# Patient Record
Sex: Male | Born: 1995
Health system: Southern US, Community
[De-identification: ages and names within clinical notes are randomized; demographics above are authoritative.]

## PROBLEM LIST (undated history)

## (undated) ENCOUNTER — Emergency Department (HOSPITAL_BASED_OUTPATIENT_CLINIC_OR_DEPARTMENT_OTHER): Admission: EM | Disposition: A | Discharge status: Home / Self Care

## (undated) DIAGNOSIS — I82409 Acute embolism and thrombosis of unspecified deep veins of unspecified lower extremity: Secondary | ICD-10-CM

---

## 2019-02-22 ENCOUNTER — Emergency Department (HOSPITAL_COMMUNITY)
Admission: EM | Admit: 2019-02-22 | Discharge: 2019-02-22 | Disposition: A | Discharge status: Home / Self Care | Payer: Self-pay | Attending: Emergency Medicine | Admitting: Emergency Medicine

## 2019-02-22 ENCOUNTER — Encounter (HOSPITAL_COMMUNITY): Payer: Self-pay

## 2019-02-22 ENCOUNTER — Other Ambulatory Visit: Payer: Self-pay

## 2019-02-22 ENCOUNTER — Emergency Department (HOSPITAL_BASED_OUTPATIENT_CLINIC_OR_DEPARTMENT_OTHER): Payer: Self-pay

## 2019-02-22 DIAGNOSIS — Z6838 Body mass index (BMI) 38.0-38.9, adult: Secondary | ICD-10-CM | POA: Insufficient documentation

## 2019-02-22 DIAGNOSIS — M7989 Other specified soft tissue disorders: Secondary | ICD-10-CM

## 2019-02-22 DIAGNOSIS — E669 Obesity, unspecified: Secondary | ICD-10-CM | POA: Insufficient documentation

## 2019-02-22 DIAGNOSIS — I82442 Acute embolism and thrombosis of left tibial vein: Secondary | ICD-10-CM | POA: Insufficient documentation

## 2019-02-22 DIAGNOSIS — M79609 Pain in unspecified limb: Secondary | ICD-10-CM

## 2019-02-22 LAB — BASIC METABOLIC PANEL
Anion gap: 10 (ref 5–15)
BUN: 15 mg/dL (ref 6–20)
CO2: 24 mmol/L (ref 22–32)
Calcium: 9.2 mg/dL (ref 8.9–10.3)
Chloride: 104 mmol/L (ref 98–111)
Creatinine, Ser: 1.17 mg/dL (ref 0.61–1.24)
GFR calc Af Amer: >60 mL/min (ref >60)
GFR calc non Af Amer: >60 mL/min (ref >60)
Glucose, Bld: 100 mg/dL — ABNORMAL HIGH (ref 70–99)
Potassium: 4 mmol/L (ref 3.5–5.1)
Sodium: 138 mmol/L (ref 135–145)

## 2019-02-22 LAB — CBC WITH DIFFERENTIAL/PLATELET
Abs Immature Granulocytes: 0.03 10*3/uL (ref 0.00–0.07)
Basophils Absolute: 0 10*3/uL (ref 0.0–0.1)
Basophils Relative: 0 %
Eosinophils Absolute: 0.2 10*3/uL (ref 0.0–0.5)
Eosinophils Relative: 2 %
HCT: 45.3 % (ref 39.0–52.0)
Hemoglobin: 15.1 g/dL (ref 13.0–17.0)
Immature Granulocytes: 0 %
Lymphocytes Relative: 23 %
Lymphs Abs: 2.1 10*3/uL (ref 0.7–4.0)
MCH: 31.2 pg (ref 26.0–34.0)
MCHC: 33.3 g/dL (ref 30.0–36.0)
MCV: 93.6 fL (ref 80.0–100.0)
Monocytes Absolute: 1.1 10*3/uL — ABNORMAL HIGH (ref 0.1–1.0)
Monocytes Relative: 12 %
Neutro Abs: 5.6 10*3/uL (ref 1.7–7.7)
Neutrophils Relative %: 63 %
Platelets: 167 10*3/uL (ref 150–400)
RBC: 4.84 MIL/uL (ref 4.22–5.81)
RDW: 13.2 % (ref 11.5–15.5)
WBC: 9 10*3/uL (ref 4.0–10.5)
nRBC: 0 % (ref 0.0–0.2)

## 2019-02-22 MED ORDER — RIVAROXABAN 20 MG PO TABS: 20.0000 mg | ORAL_TABLET | Freq: Every day | ORAL | 0 refills | Status: DC

## 2019-02-22 MED ORDER — HYDROCODONE-ACETAMINOPHEN 5-325 MG PO TABS
1.0000 | ORAL_TABLET | Freq: Once | ORAL | Status: AC
  Administered 2019-02-22: 1 via ORAL
  Filled 2019-02-22: qty 1

## 2019-02-22 MED ORDER — RIVAROXABAN 15 MG PO TABS
15.0000 mg | ORAL_TABLET | Freq: Two times a day (BID) | ORAL | Status: DC
  Administered 2019-02-22: 12:00:00 15 mg via ORAL
  Filled 2019-02-22 (×2): qty 1

## 2019-02-22 MED ORDER — HYDROCODONE-ACETAMINOPHEN 5-325 MG PO TABS: 1.0000 | ORAL_TABLET | Freq: Four times a day (QID) | ORAL | 0 refills | Status: DC | PRN

## 2019-02-22 MED ORDER — RIVAROXABAN (XARELTO) VTE STARTER PACK (15 & 20 MG): ORAL_TABLET | ORAL | 0 refills | Status: DC

## 2019-02-22 MED FILL — HYDROCODON-APAP 5-325: 5-325 | 3 days supply | Qty: 10 | Fill #0

## 2019-02-22 MED FILL — XARELTO STARTER PACK: 15 & 20 | 30 days supply | Qty: 51 | Fill #0

## 2019-02-22 NOTE — Progress Notes (Signed)
[facility] for Xarelto Indication: DVT  No Known Allergies  Patient Measurements: Height: 5\' 9"  (175.3 cm) Weight: 261 lb (118.4 kg) IBW/kg (Calculated) : 70.7  Vital Signs: Temp: 98.3 F (36.8 C) (08/27 1133) Temp Source: Oral (08/27 1133) BP: 140/72 (08/27 1230) Pulse Rate: 77 (08/27 1230)  Labs: Recent Labs    02/22/19 1040  HGB 15.1  HCT 45.3  PLT 167  CREATININE 1.17    Estimated Creatinine Clearance: 126.9 mL/min (by C-G formula based on SCr of 1.17 mg/dL).   Medical History: History reviewed. No pertinent past medical history.  Assessment: 8 yoM w/o PMH presenting with L calf pain. Found to have DVT. No symptoms of PE. Pharmacy to start Xarelto for ED discharge  Goal of Therapy:  Prevent thrombus recurrence/worsening   Plan:   Xarelto 15 mg PO bid x 21 days, followed by 20 mg PO daily  Patient educated by Pharmacy prior to discharge  Reuel Boom, PharmD, [place] 02/22/2019, 12:52 PM

## 2019-02-22 NOTE — Discharge Instructions (Addendum)
Rivaroxaban (Xarelto) ED Discharge Instructions  ° °Patient received a prescription for  °Xarelto 15 & 20 mg - 51 tablet VTE STARTER PACK. °  °Patient understands only the FIRST 30 DAYS OF TREATMENT  °will be provided by the starter pack. °  °Patient understands to contact primary care doctor or ED immediately if for any reason is unable to fill the starter pack prescription. ° °Patient must schedule a follow-up appointment with primary care doctor within 15 days of discharge in order to receive the maintenance prescription and clinical follow up. ° °Patient has received an education kit containing ([facility] Trial Offer Card, DVT/PE brochure, Dosing Diary, and Xarelto Medication Guide).  ° °If not performed in the ED, patient will receive medication counseling by a [facility] pharmacist via phone follow-up within the next 72 hours. Pharmacist to review signs and symptoms of bleeding and proper use of this medication.  ° °Call 911 or return immediately to the nearest ED if you develop bleeding (e.g. nose, gums, vomit, urine, bloody or dark stools), unusual bruising, head trauma (even if minor), severe headache, altered mental status, change in speech, weakness on one side of body, shortness of breath, swollen lips/tongue/face/neck, chest pain, or other concerns. °   °Information on my medicine - XARELTO® (rivaroxaban)  °This medication education was provided to me or my healthcare representative as part of my discharge instructions. °  °WHY WAS XARELTO® PRESCRIBED FOR YOU?  °Xarelto® was prescribed to treat blood clots that may have been found in the veins of your legs (deep vein thrombosis) or in your lungs (pulmonary embolism) and to reduce the risk of them occurring again. °  °WHAT DO YOU NEED TO KNOW ABOUT XARELTO®?  °The starting dose is one 15 mg tablet taken TWICE daily with food for the FIRST 21 DAYS then on day 22 the dose is changed to one 20 mg tablet taken ONCE A DAY with your evening meal. °  °DO NOT  stop taking Xarelto® without talking to the health care provider who prescribed the medication. Refill your prescription for 20 mg tablets before you run out.  °After discharge, you should have regular check-up appointments with your healthcare provider that is prescribing your Xarelto®. In the future your dose may need to be changed if your kidney function changes by a significant amount.  ° °WHAT DO YOU DO IF YOU MISS A DOSE?  °If you are taking Xarelto® TWICE DAILY and you miss a dose, take it as soon as you remember. You may take two 15 mg tablets (total 30 mg) at the same time then resume your regularly scheduled 15 mg twice daily the next day. °  °If you are taking Xarelto® ONCE DAILY and you miss a dose, take it as soon as you remember on the same day then continue your regularly scheduled once daily regimen the next day. Do not take two doses of Xarelto® at the same time. °  °IMPORTANT SAFETY INFORMATION  °Xarelto® is a blood thinner medicine that can cause bleeding. You should call your healthcare provider right away if you experience any of the following:  °-  Bleeding from an injury or your nose that does not stop.  °-  Unusual colored urine (red or dark brown) or unusual colored stools (red or black).  °-  Unusual bruising for unknown reasons.  °-  A serious fall or if you hit your head (even if there is no bleeding).  ° °Some medicines may interact with Xarelto® and   might increase your risk of bleeding while on Xarelto®. To help avoid this, consult your healthcare provider or pharmacist prior to using any new prescription or non-prescription medications, including herbals, vitamins, non-steroidal anti-inflammatory drugs (NSAIDs) and supplements.  ° °This website has more information on Xarelto®: www.xarelto.com. ° °

## 2019-02-22 NOTE — ED Triage Notes (Signed)
Patient c/o left leg pain/cramp and slight swelling states he woke with a cramp x 5 days. Patient states he has to stand for 5 minutes or so and then he can start walking.

## 2019-02-22 NOTE — TOC Initial Note (Addendum)
Transition of Care [facility]) - Initial/Assessment Note    Patient Details  Name: Jared Carlson MRN: [id number] Date of Birth: 09-16-1995  [facility]) CM/SW Contact:    Erenest Rasher, RN Phone Number: [phone] 02/22/2019, 1:19 PM  Clinical Narrative:                 Spoke to pt and girlfriend, Jared Carlson in room.  Provided pt with Xarelto 30 day free trial card. [facility] and they do have in [place]. Provided with Xarelto Patient Assistance Application to complete his portion and bring with him to his appt. Pt is currently without insurance and PCP. Explained TOC CM will attempted to secure an earlier appt at [facility]. Appt was arranged for [facility] on 03/23/2019 at 230pm. Provided brochure for both clinics.     Expected Discharge Plan: Home/Self Care Barriers to Discharge: No Barriers Identified   Patient Goals and CMS Choice        Expected Discharge Plan and Services Expected Discharge Plan: Home/Self Care In-house Referral: PCP / [facility] Discharge Planning Services: CM Consult, Follow-up appt scheduled, Medication Assistance                                          Prior Living Arrangements/Services   Lives with:: Significant Other Patient language and need for interpreter reviewed:: Yes Do you feel safe going back to the place where you live?: Yes      Need for Family Participation in Patient Care: No (Comment) Care giver support system in place?: No (comment)   Criminal Activity/Legal Involvement Pertinent to Current Situation/Hospitalization: No - Comment as needed  Activities of Daily Living      Permission Sought/Granted Permission sought to share information with : Case Manager Permission granted to share information with : Yes, Verbal Permission Granted  Share Information with NAME: Jared Carlson     Permission granted to share info w Relationship: girlfriend  Permission granted to share  info w Contact Information: [phone]  Emotional Assessment Appearance:: Appears stated age Attitude/Demeanor/Rapport: Engaged Affect (typically observed): Accepting Orientation: : Oriented to Self, Oriented to Place, Oriented to  Time, Oriented to Situation   Psych Involvement: No (comment)  Admission diagnosis:  leg pain / cramps  There are no active problems to display for this patient.  PCP:  Patient, No Pcp Per Pharmacy:   [place], [place] - [address] [place] [address] Phone: [phone] Fax: [phone]     Social Determinants of Health (SDOH) Interventions    Readmission Risk Interventions No flowsheet data found.

## 2019-02-22 NOTE — Progress Notes (Signed)
VASCULAR LAB PRELIMINARY  PRELIMINARY  PRELIMINARY  PRELIMINARY  Left lower extremity venous duplex completed.    Preliminary report:  See CV proc for preliminary results.  Gave report to Dr. Leontine Locket, [facility], RVT 02/22/2019, 10:10 AM   ;

## 2019-02-22 NOTE — ED Notes (Signed)
VAS at bedside.

## 2019-02-22 NOTE — ED Notes (Signed)
Pt verbalizes understanding of d/c instructions. Belonging returned. Ambulatory out of ED on crutches

## 2019-02-22 NOTE — ED Provider Notes (Signed)
[facility] Provider Note   CSN: [id number] Arrival date & time: 02/22/19  0818     History   Chief Complaint Chief Complaint  Patient presents with   Leg Pain    HPI Jared Carlson is a 23 y.o. male.     Jared Carlson is a 23 y.o. male who is otherwise healthy, presents to the ED for evaluation of left leg pain and cramps that have been ongoing for 5 days.  He reports that 5 days ago he noticed a severe cramp in his left calf, he thought this was a charley horse and would go away but cramps have persisted over the past 5 days.  He reports when he tries to stand up he has sharp pains to the back of his left calf and has to stand there for a few minutes before he is able to walk.  He has noticed slight swelling in the leg, denies redness or other skin changes.  Reports that he has been doing some workouts in the evening at home, but denies any focal injury to the calf, no cuts or scrapes.  He denies any fevers or chills, although does have a low-grade temp of 100 F on arrival.  He denies any cough, chest pain or shortness of breath.  No abdominal pain, nausea or vomiting, no dysuria.  No diarrhea or constipation.  Denies any known sick contacts.  No prior history of blood clots.  Nothing for pain prior to arrival.  No other aggravating or alleviating factors.     History reviewed. No pertinent past medical history.  There are no active problems to display for this patient.   History reviewed. No pertinent surgical history.      Home Medications    Prior to Admission medications   Not on File    Family History Family History  Problem Relation Age of Onset   Healthy Mother    Healthy Father     Social History Social History   Tobacco Use   Smoking status: Never Smoker   Smokeless tobacco: Never Used  Substance Use Topics   Alcohol use: Never    Frequency: Never   Drug use: Never     Allergies   Patient has no known  allergies.   Review of Systems Review of Systems  Constitutional: Negative for chills and fever.  HENT: Negative.   Respiratory: Negative for cough and shortness of breath.   Cardiovascular: Positive for leg swelling. Negative for chest pain.  Gastrointestinal: Negative for abdominal pain, nausea and vomiting.  Musculoskeletal: Positive for myalgias.  Skin: Negative for color change, rash and wound.  Neurological: Negative for weakness and numbness.  All other systems reviewed and are negative.    Physical Exam Updated Vital Signs BP (!) 146/82 (BP Location: Right Arm)    Pulse 97    Temp 100 F (37.8 C) (Oral)    Resp 18    Ht 5\' 9"  (1.753 m)    Wt 118.4 kg    SpO2 97%    BMI 38.54 kg/m   Physical Exam Vitals signs and nursing note reviewed.  Constitutional:      General: He is not in acute distress.    Appearance: Normal appearance. He is well-developed. He is obese. He is not ill-appearing or diaphoretic.     Comments: Well-appearing and in no acute distress.  HENT:     Head: Normocephalic and atraumatic.     Mouth/Throat:     Mouth:  Mucous membranes are moist.     Pharynx: Oropharynx is clear.  Eyes:     General:        Right eye: No discharge.        Left eye: No discharge.     Pupils: Pupils are equal, round, and reactive to light.  Neck:     Musculoskeletal: Neck supple.  Cardiovascular:     Rate and Rhythm: Normal rate and regular rhythm.     Heart sounds: Normal heart sounds.  Pulmonary:     Effort: Pulmonary effort is normal. No respiratory distress.     Breath sounds: Normal breath sounds. No wheezing or rales.     Comments: Respirations equal and unlabored, patient able to speak in full sentences, lungs clear to auscultation bilaterally Abdominal:     General: Bowel sounds are normal. There is no distension.     Palpations: Abdomen is soft. There is no mass.     Tenderness: There is no abdominal tenderness. There is no guarding.     Comments: Abdomen  soft, nondistended, nontender to palpation in all quadrants without guarding or peritoneal signs  Musculoskeletal:        General: Tenderness present. No deformity.     Left lower leg: Edema present.     Comments: Focal tenderness over the left posterior calf with slight amount of swelling, no erythema, the calf is a bit warm to the touch, there is no palpable deformity or crepitus, no overlying wounds or skin changes.  Positive Homans sign.  2+ DP and TP pulses, normal sensation and strength. Right lower extremity nontender to palpation with normal distal pulses.  Skin:    General: Skin is warm and dry.     Capillary Refill: Capillary refill takes less than 2 seconds.  Neurological:     Mental Status: He is alert.     Coordination: Coordination normal.     Comments: Speech is clear, able to follow commands Moves extremities without ataxia, coordination intact  Psychiatric:        Mood and Affect: Mood normal.        Behavior: Behavior normal.      ED Treatments / Results  Labs (all labs ordered are listed, but only abnormal results are displayed) Labs Reviewed  BASIC METABOLIC PANEL - Abnormal; Notable for the following components:      Result Value   Glucose, Bld 100 (*)    All other components within normal limits  CBC WITH DIFFERENTIAL/PLATELET - Abnormal; Notable for the following components:   Monocytes Absolute 1.1 (*)    All other components within normal limits    EKG None  Radiology Vas [place] Lower Extremity Venous (dvt) (mc And Wl 7a-7p)  Result Date: 02/22/2019  Lower Venous Study Indications: Pain, Swelling, and Calf cramp X 5 days.  Comparison Study: No prior study on file for comparison Performing Technologist: Sherren Kernsandace Kanady RVS  Examination Guidelines: A complete evaluation includes B-mode imaging, spectral Doppler, color Doppler, and power Doppler as needed of all accessible portions of each vessel. Bilateral testing is considered an integral part of a complete  examination. Limited examinations for reoccurring indications may be performed as noted.  +-----+---------------+---------+-----------+----------+--------------+  RIGHT Compressibility Phasicity Spontaneity Properties Thrombus Aging  +-----+---------------+---------+-----------+----------+--------------+  CFV   Full            Yes       Yes                                    +-----+---------------+---------+-----------+----------+--------------+   +---------+---------------+---------+-----------+----------+--------------+  LEFT      Compressibility Phasicity Spontaneity Properties Thrombus Aging  +---------+---------------+---------+-----------+----------+--------------+  CFV       Full            Yes       Yes                                    +---------+---------------+---------+-----------+----------+--------------+  SFJ       Full                                                             +---------+---------------+---------+-----------+----------+--------------+  FV Prox   Full                                                             +---------+---------------+---------+-----------+----------+--------------+  FV Mid    Full                                                             +---------+---------------+---------+-----------+----------+--------------+  FV Distal Full                                                             +---------+---------------+---------+-----------+----------+--------------+  PFV       Full                                                             +---------+---------------+---------+-----------+----------+--------------+  POP       Full            Yes       Yes                                    +---------+---------------+---------+-----------+----------+--------------+  PTV       None                                             Acute           +---------+---------------+---------+-----------+----------+--------------+  PERO      Full                                                              +---------+---------------+---------+-----------+----------+--------------+  Summary: Right: No evidence of common femoral vein obstruction. Left: Findings consistent with acute deep vein thrombosis involving the left posterior tibial veins.  *See table(s) above for measurements and observations.    Preliminary     Procedures Procedures (including critical care time)  Medications Ordered in ED Medications  Rivaroxaban (XARELTO) tablet 15 mg (15 mg Oral Given 02/22/19 1153)  HYDROcodone-acetaminophen (NORCO/VICODIN) 5-325 MG per tablet 1 tablet (1 tablet Oral Given 02/22/19 1134)     Initial Impression / Assessment and Plan / ED Course  I have reviewed the triage vital signs and the nursing notes.  Pertinent labs & imaging results that were available during my care of the patient were reviewed by me and considered in my medical decision making (see chart for details).  Patient presents with 5 days of left calf pain and tenderness.  He has focal tenderness over the lateral left calf, pain is made worse with movement, positive Homans sign.  It is somewhat warm to the touch but there is no overlying redness or skin changes.  On arrival patient has low-grade temp of 100, but all other vitals are normal, he denies fevers or infectious symptoms at home.  I have strong clinical suspicion for DVT, will check vascular ultrasound and basic labs.  Given muscle pain with possible low-grade temperature this also raises concern for possible myositis.  We will also check a CK level.  Pain medication given.  Prior to labs being collected, ultrasound completed, positive for acute DVT in the left posterior tibial veins, this certainly explains patient's symptoms.  Will discontinue CK but collect basic labs to check platelet count and kidney function prior to starting patient on oral anticoagulation.  As long as labs look good we will plan to start patient on Xarelto.  Patient does not have a  PCP that he currently follows with, will consult case management to ensure follow-up for continued anticoagulation management.  Labs reassuring with normal renal function, no acute electrolyte derangements and no leukocytosis.  Pharmacy consulted for Xarelto, first dose given here in the ED, patient has been provided Xarelto education packet and coupon card for starter pack.  Case management has ensured that the patient has a follow-up appointment with [facility] on 9/25 and they are working on trying to get patient a sooner follow-up.  Case management did ask that I provide additional month supply of medication in case patient is unable to follow-up by 9/27.  Prescriptions for starter pack, second month of medication and short course of pain medication given, patient also request crutches due to difficulty walking due to pain, these were provided.  Return precautions discussed and stressed the importance of follow-up.  Patient expresses understanding and agreement with plan.  Discharged home in good condition.  Patient discussed with Dr. Sedonia Small, who saw patient as well and agrees with plan.   Final Clinical Impressions(s) / ED Diagnoses   Final diagnoses:  Acute deep vein thrombosis (DVT) of tibial vein of left lower extremity [facility])    ED Discharge Orders         Ordered    rivaroxaban (XARELTO) 20 MG TABS tablet  Daily with supper     02/22/19 1259    Rivaroxaban 15 & 20 MG TBPK     02/22/19 1232    HYDROcodone-acetaminophen (NORCO) 5-325 MG tablet  Every 6 hours PRN     02/22/19 1309           Benedetto Goad [place], [place] 02/22/19  1334    Sabas Sous, MD 03/01/19 [phone]

## 2019-02-23 ENCOUNTER — Telehealth: Payer: Self-pay | Admitting: *Deleted

## 2019-02-23 NOTE — Telephone Encounter (Signed)
TOC CM contacted pt with his earlier appt at [facility] on 03/14/2019 at 230 pm. Clinic will assist him with completing the patient assistance program application for Xarelto. [facility] pharmacy will help process application to help him qualify for free medication through [facility] and [place] PAP program. Jonnie Finner RN [place], [place] ED TOC CM [phone]

## 2019-02-25 ENCOUNTER — Other Ambulatory Visit: Payer: Self-pay

## 2019-02-25 ENCOUNTER — Encounter (HOSPITAL_COMMUNITY): Payer: Self-pay

## 2019-02-25 ENCOUNTER — Emergency Department (HOSPITAL_COMMUNITY)
Admission: EM | Admit: 2019-02-25 | Discharge: 2019-02-25 | Disposition: A | Discharge status: Home / Self Care | Payer: Self-pay | Attending: Emergency Medicine | Admitting: Emergency Medicine

## 2019-02-25 DIAGNOSIS — Z7901 Long term (current) use of anticoagulants: Secondary | ICD-10-CM | POA: Insufficient documentation

## 2019-02-25 DIAGNOSIS — Z79899 Other long term (current) drug therapy: Secondary | ICD-10-CM | POA: Insufficient documentation

## 2019-02-25 DIAGNOSIS — I82492 Acute embolism and thrombosis of other specified deep vein of left lower extremity: Secondary | ICD-10-CM | POA: Insufficient documentation

## 2019-02-25 DIAGNOSIS — I82462 Acute embolism and thrombosis of left calf muscular vein: Secondary | ICD-10-CM

## 2019-02-25 HISTORY — DX: Acute embolism and thrombosis of unspecified deep veins of unspecified lower extremity: I82.409

## 2019-02-25 MED ORDER — OXYCODONE-ACETAMINOPHEN 5-325 MG PO TABS: 2.0000 | ORAL_TABLET | ORAL | 0 refills | Status: DC | PRN

## 2019-02-25 MED ORDER — METHOCARBAMOL 750 MG PO TABS: 750.0000 mg | ORAL_TABLET | Freq: Four times a day (QID) | ORAL | 0 refills | Status: DC

## 2019-02-25 MED ORDER — OXYCODONE-ACETAMINOPHEN 5-325 MG PO TABS
2.0000 | ORAL_TABLET | Freq: Once | ORAL | Status: AC
  Administered 2019-02-25: 2 via ORAL
  Filled 2019-02-25: qty 2

## 2019-02-25 NOTE — ED Provider Notes (Signed)
[facility]-EMERGENCY DEPT Provider Note   CSN: [id number] Arrival date & time: 02/25/19  1243     History   Chief Complaint No chief complaint on file.   HPI Jared Carlson is a 23 y.o. male.     23 year old male presents with worsening left calf pain.  Recently diagnosed with DVT and started on Xarelto which she has been compliant.  He denies any chest pain or shortness of breath.  Complains of dull ache in his left calf which is been worse with walking.  No numbness or tingling or foot drop to his left foot.  Symptoms better with rest.     Past Medical History:  Diagnosis Date  . DVT (deep venous thrombosis) (HCC)     There are no active problems to display for this patient.   History reviewed. No pertinent surgical history.      Home Medications    Prior to Admission medications   Medication Sig Start Date End Date Taking? Authorizing Provider  HYDROcodone-acetaminophen (NORCO) 5-325 MG tablet Take 1 tablet by mouth every 6 (six) hours as needed. 02/22/19   Dartha LodgeFord, Kelsey N, PA-C  rivaroxaban (XARELTO) 20 MG TABS tablet Take 1 tablet (20 mg total) by mouth daily with supper. For use after pt has completed starter pack 03/25/19   Dartha LodgeFord, Kelsey N, PA-C  Rivaroxaban 15 & 20 MG TBPK Take as directed on package: Start with one 15mg  tablet by mouth twice a day with food. On Day 22, switch to one 20mg  tablet once a day with food. 02/22/19   Dartha LodgeFord, Kelsey N, PA-C    Family History Family History  Problem Relation Age of Onset  . Healthy Mother   . Healthy Father     Social History Social History   Tobacco Use  . Smoking status: Never Smoker  . Smokeless tobacco: Never Used  Substance Use Topics  . Alcohol use: Never    Frequency: Never  . Drug use: Never     Allergies   Patient has no known allergies.   Review of Systems Review of Systems  All other systems reviewed and are negative.    Physical Exam Updated Vital Signs BP (!)  178/84   Pulse 85   Temp 98.6 F (37 C) (Oral)   Resp 16   Ht 1.753 m (5\' 9" )   Wt 118.4 kg   SpO2 98%   BMI 38.54 kg/m   Physical Exam Vitals signs and nursing note reviewed.  Constitutional:      General: He is not in acute distress.    Appearance: Normal appearance. He is well-developed. He is not toxic-appearing.  HENT:     Head: Normocephalic and atraumatic.  Eyes:     General: Lids are normal.     Conjunctiva/sclera: Conjunctivae normal.     Pupils: Pupils are equal, round, and reactive to light.  Neck:     Musculoskeletal: Normal range of motion and neck supple.     Thyroid: No thyroid mass.     Trachea: No tracheal deviation.  Cardiovascular:     Rate and Rhythm: Normal rate and regular rhythm.     Heart sounds: Normal heart sounds. No murmur. No gallop.   Pulmonary:     Effort: Pulmonary effort is normal. No respiratory distress.     Breath sounds: Normal breath sounds. No stridor. No decreased breath sounds, wheezing, rhonchi or rales.  Abdominal:     General: Bowel sounds are normal. There is no distension.  Palpations: Abdomen is soft.     Tenderness: There is no abdominal tenderness. There is no rebound.  Musculoskeletal: Normal range of motion.        General: No tenderness.       Legs:  Skin:    General: Skin is warm and dry.     Findings: No abrasion or rash.  Neurological:     Mental Status: He is alert and oriented to person, place, and time.     GCS: GCS eye subscore is 4. GCS verbal subscore is 5. GCS motor subscore is 6.     Cranial Nerves: No cranial nerve deficit.     Sensory: No sensory deficit.  Psychiatric:        Speech: Speech normal.        Behavior: Behavior normal.      ED Treatments / Results  Labs (all labs ordered are listed, but only abnormal results are displayed) Labs Reviewed - No data to display  EKG None  Radiology No results found.  Procedures Procedures (including critical care time)  Medications Ordered  in ED Medications  oxyCODONE-acetaminophen (PERCOCET/ROXICET) 5-325 MG per tablet 2 tablet (has no administration in time range)     Initial Impression / Assessment and Plan / ED Course  I have reviewed the triage vital signs and the nursing notes.  Pertinent labs & imaging results that were available during my care of the patient were reviewed by me and considered in my medical decision making (see chart for details).        Patient medicated for pain here and feels better.  Neurovascular intact.  No evidence of compartment syndrome.  Will adjust patient's analgesics and return precautions given  Final Clinical Impressions(s) / ED Diagnoses   Final diagnoses:  None    ED Discharge Orders    None       Lacretia Leigh, MD 02/25/19 1328

## 2019-02-25 NOTE — ED Triage Notes (Signed)
Per EMS: Pt seen a few days ago for a DVT in LL leg.  Pt D/C on xarelto, and given pain medications.  Pt states his pain has gotten worse in that leg since then.

## 2019-02-25 NOTE — Discharge Instructions (Addendum)
Return here at once for chest pain or shortness of breath.  Take the medications as directed.

## 2019-02-26 MED FILL — METHOCARBAMOL 750 MG TABS: 750 | 7 days supply | Qty: 30 | Fill #0

## 2019-02-26 MED FILL — OXYCODONE-ACETAMINOPHEN 5-3: 5-325 | 2 days supply | Qty: 15 | Fill #0

## 2019-03-14 ENCOUNTER — Encounter (INDEPENDENT_AMBULATORY_CARE_PROVIDER_SITE_OTHER): Payer: Self-pay | Admitting: Primary Care

## 2019-03-14 ENCOUNTER — Other Ambulatory Visit: Payer: Self-pay

## 2019-03-14 ENCOUNTER — Ambulatory Visit (INDEPENDENT_AMBULATORY_CARE_PROVIDER_SITE_OTHER): Payer: Self-pay | Admitting: Primary Care

## 2019-03-14 VITALS — BP 115/73 | HR 75 | Temp 98.2°F | Ht 69.0 in | Wt 259.4 lb

## 2019-03-14 DIAGNOSIS — M79662 Pain in left lower leg: Secondary | ICD-10-CM

## 2019-03-14 DIAGNOSIS — Z09 Encounter for follow-up examination after completed treatment for conditions other than malignant neoplasm: Secondary | ICD-10-CM

## 2019-03-14 DIAGNOSIS — I824Y2 Acute embolism and thrombosis of unspecified deep veins of left proximal lower extremity: Secondary | ICD-10-CM

## 2019-03-14 DIAGNOSIS — Z6838 Body mass index (BMI) 38.0-38.9, adult: Secondary | ICD-10-CM

## 2019-03-14 DIAGNOSIS — E669 Obesity, unspecified: Secondary | ICD-10-CM

## 2019-03-14 DIAGNOSIS — Z0000 Encounter for general adult medical examination without abnormal findings: Secondary | ICD-10-CM

## 2019-03-14 DIAGNOSIS — Z114 Encounter for screening for human immunodeficiency virus [HIV]: Secondary | ICD-10-CM

## 2019-03-14 MED ORDER — HYDROCODONE-ACETAMINOPHEN 7.5-325 MG PO TABS: 1.0000 | ORAL_TABLET | Freq: Four times a day (QID) | ORAL | 0 refills | Status: DC | PRN

## 2019-03-14 MED FILL — HYDROCODON-APAP 7.5-325: 7.5-325 | 7 days supply | Qty: 30 | Fill #0

## 2019-03-14 NOTE — Patient Instructions (Signed)
Hypertension, Adult Hypertension is another name for high blood pressure. High blood pressure forces your heart to work harder to pump blood. This can cause problems over time. There are two numbers in a blood pressure reading. There is a top number (systolic) over a bottom number (diastolic). It is best to have a blood pressure that is below 120/80. Healthy choices can help lower your blood pressure, or you may need medicine to help lower it. What are the causes? The cause of this condition is not known. Some conditions may be related to high blood pressure. What increases the risk?  Smoking.  Having type 2 diabetes mellitus, high cholesterol, or both.  Not getting enough exercise or physical activity.  Being overweight.  Having too much fat, sugar, calories, or salt (sodium) in your diet.  Drinking too much alcohol.  Having long-term (chronic) kidney disease.  Having a family history of high blood pressure.  Age. Risk increases with age.  Race. You may be at higher risk if you are African American.  Gender. Men are at higher risk than women before age 45. After age 65, women are at higher risk than men.  Having obstructive sleep apnea.  Stress. What are the signs or symptoms?  High blood pressure may not cause symptoms. Very high blood pressure (hypertensive crisis) may cause: ? Headache. ? Feelings of worry or nervousness (anxiety). ? Shortness of breath. ? Nosebleed. ? A feeling of being sick to your stomach (nausea). ? Throwing up (vomiting). ? Changes in how you see. ? Very bad chest pain. ? Seizures. How is this treated?  This condition is treated by making healthy lifestyle changes, such as: ? Eating healthy foods. ? Exercising more. ? Drinking less alcohol.  Your health care provider may prescribe medicine if lifestyle changes are not enough to get your blood pressure under control, and if: ? Your top number is above 130. ? Your bottom number is above 80.   Your personal target blood pressure may vary. Follow these instructions at home: Eating and drinking   If told, follow the DASH eating plan. To follow this plan: ? Fill one half of your plate at each meal with fruits and vegetables. ? Fill one fourth of your plate at each meal with whole grains. Whole grains include whole-wheat pasta, brown rice, and whole-grain bread. ? Eat or drink low-fat dairy products, such as skim milk or low-fat yogurt. ? Fill one fourth of your plate at each meal with low-fat (lean) proteins. Low-fat proteins include fish, chicken without skin, eggs, beans, and tofu. ? Avoid fatty meat, cured and processed meat, or chicken with skin. ? Avoid pre-made or processed food.  Eat less than 1,500 mg of salt each day.  Do not drink alcohol if: ? Your doctor tells you not to drink. ? You are pregnant, may be pregnant, or are planning to become pregnant.  If you drink alcohol: ? Limit how much you use to:  0-1 drink a day for women.  0-2 drinks a day for men. ? Be aware of how much alcohol is in your drink. In the [place] one drink equals one 12 oz bottle of beer (355 mL), one 5 oz glass of wine (148 mL), or one 1 oz glass of hard liquor (44 mL). Lifestyle   Work with your doctor to stay at a healthy weight or to lose weight. Ask your doctor what the best weight is for you.  Get at least 30 minutes of exercise most   days of the week. This may include walking, swimming, or biking.  Get at least 30 minutes of exercise that strengthens your muscles (resistance exercise) at least 3 days a week. This may include lifting weights or doing Pilates.  Do not use any products that contain nicotine or tobacco, such as cigarettes, e-cigarettes, and chewing tobacco. If you need help quitting, ask your doctor.  Check your blood pressure at home as told by your doctor.  Keep all follow-up visits as told by your doctor. This is important. Medicines  Take over-the-counter and  prescription medicines only as told by your doctor. Follow directions carefully.  Do not skip doses of blood pressure medicine. The medicine does not work as well if you skip doses. Skipping doses also puts you at risk for problems.  Ask your doctor about side effects or reactions to medicines that you should watch for. Contact a doctor if you:  Think you are having a reaction to the medicine you are taking.  Have headaches that keep coming back (recurring).  Feel dizzy.  Have swelling in your ankles.  Have trouble with your vision. Get help right away if you:  Get a very bad headache.  Start to feel mixed up (confused).  Feel weak or numb.  Feel faint.  Have very bad pain in your: ? Chest. ? Belly (abdomen).  Throw up more than once.  Have trouble breathing. Summary  Hypertension is another name for high blood pressure.  High blood pressure forces your heart to work harder to pump blood.  For most people, a normal blood pressure is less than 120/80.  Making healthy choices can help lower blood pressure. If your blood pressure does not get lower with healthy choices, you may need to take medicine. This information is not intended to replace advice given to you by your health care provider. Make sure you discuss any questions you have with your health care provider. Document Released: 12/01/2007 Document Revised: 02/22/2018 Document Reviewed: 02/22/2018 Elsevier Patient Education  2020 [employer] Deep Vein Thrombosis  Deep vein thrombosis (DVT) is a condition in which a blood clot forms in a deep vein, such as a lower leg, thigh, or arm vein. A clot is blood that has thickened into a gel or solid. This condition is dangerous. It can lead to serious and even life-threatening complications if the clot travels to the lungs and causes a blockage (pulmonary embolism). It can also damage veins in the leg. This can result in leg pain, swelling, discoloration, and sores  (post-thrombotic syndrome). What are the causes? This condition may be caused by:  A slowdown of blood flow.  Damage to a vein.  A condition that causes blood to clot more easily, such as an inherited clotting disorder. What increases the risk? The following factors may make you more likely to develop this condition:  Being overweight.  Being older, especially over age 83.  Sitting or lying down for more than four hours.  Being in the hospital.  Lack of physical activity (sedentary lifestyle).  Pregnancy, being in childbirth, or having recently given birth.  Taking medicines that contain estrogen, such as medicines to prevent pregnancy.  Smoking.  A history of any of the following: ? Blood clots or a blood clotting disease. ? Peripheral vascular disease. ? Inflammatory bowel disease. ? Cancer. ? Heart disease. ? Genetic conditions that affect how your blood clots, such as Factor V Leiden mutation. ? Neurological diseases that affect your legs (leg paresis). ?  A recent injury, such as a car accident. ? Major or lengthy surgery. ? A central line placed inside a large vein. What are the signs or symptoms? Symptoms of this condition include:  Swelling, pain, or tenderness in an arm or leg.  Warmth, redness, or discoloration in an arm or leg. If the clot is in your leg, symptoms may be more noticeable or worse when you stand or walk. Some people may not develop any symptoms. How is this diagnosed? This condition is diagnosed with:  A medical history and physical exam.  Tests, such as: ? Blood tests. These are done to check how well your blood clots. ? Ultrasound. This is done to check for clots. ? Venogram. For this test, contrast dye is injected into a vein and X-rays are taken to check for any clots. How is this treated? Treatment for this condition depends on:  The cause of your DVT.  Your risk for bleeding or developing more clots.  Any other medical  conditions that you have. Treatment may include:  Taking a blood thinner (anticoagulant). This type of medicine prevents clots from forming. It may be taken by mouth, injected under the skin, or injected through an IV (catheter).  Injecting clot-dissolving medicines into the affected vein (catheter-directed thrombolysis).  Having surgery. Surgery may be done to: ? Remove the clot. ? Place a filter in a large vein to catch blood clots before they reach the lungs. Some treatments may be continued for up to six months. Follow these instructions at home: If you are taking blood thinners:  Take the medicine exactly as told by your health care provider. Some blood thinners need to be taken at the same time every day. Do not skip a dose.  Talk with your health care provider before you take any medicines that contain aspirin or NSAIDs. These medicines increase your risk for dangerous bleeding.  Ask your health care provider about foods and drugs that could change the way the medicine works (may interact). Avoid those things if your health care provider tells you to do so.  Blood thinners can cause easy bruising and may make it difficult to stop bleeding. Because of this: ? Be very careful when using knives, scissors, or other sharp objects. ? Use an electric razor instead of a blade. ? Avoid activities that could cause injury or bruising, and follow instructions about how to prevent falls.  Wear a medical alert bracelet or carry a card that lists what medicines you take. General instructions  Take over-the-counter and prescription medicines only as told by your health care provider.  Return to your normal activities as told by your health care provider. Ask your health care provider what activities are safe for you.  Wear compression stockings if recommended by your health care provider.  Keep all follow-up visits as told by your health care provider. This is important. How is this  prevented? To lower your risk of developing this condition again:  For 30 or more minutes every day, do an activity that: ? Involves moving your arms and legs. ? Increases your heart rate.  When traveling for longer than four hours: ? Exercise your arms and legs every hour. ? Drink plenty of water. ? Avoid drinking alcohol.  Avoid sitting or lying for a long time without moving your legs.  If you have surgery or you are hospitalized, ask about ways to prevent blood clots. These may include taking frequent walks or using anticoagulants.  Stay at a  healthy weight.  If you are a woman who is older than age 13, avoid unnecessary use of medicines that contain estrogen, such as some birth control pills.  Do not use any products that contain nicotine or tobacco, such as cigarettes and e-cigarettes. This is especially important if you take estrogen medicines. If you need help quitting, ask your health care provider. Contact a health care provider if:  You miss a dose of your blood thinner.  Your menstrual period is heavier than usual.  You have unusual bruising. Get help right away if:  You have: ? New or increased pain, swelling, or redness in an arm or leg. ? Numbness or tingling in an arm or leg. ? Shortness of breath. ? Chest pain. ? A rapid or irregular heartbeat. ? A severe headache or confusion. ? A cut that will not stop bleeding.  There is blood in your vomit, stool, or urine.  You have a serious fall or accident, or you hit your head.  You feel light-headed or dizzy.  You cough up blood. These symptoms may represent a serious problem that is an emergency. Do not wait to see if the symptoms will go away. Get medical help right away. Call your local emergency services (911 in the [place] Do not drive yourself to the hospital. Summary  Deep vein thrombosis (DVT) is a condition in which a blood clot forms in a deep vein, such as a lower leg, thigh, or arm vein.   Symptoms can include swelling, warmth, pain, and redness in your leg or arm.  This condition may be treated with a blood thinner (anticoagulant medicine), medicine that is injected to dissolve blood clots,compression stockings, or surgery.  If you are prescribed blood thinners, take them exactly as told. This information is not intended to replace advice given to you by your health care provider. Make sure you discuss any questions you have with your health care provider. Document Released: 06/14/2005 Document Revised: 05/27/2017 Document Reviewed: 11/12/2016 Elsevier Patient Education  2020 [employer].

## 2019-03-14 NOTE — Progress Notes (Signed)
New Patient Office Visit  Subjective:  Patient ID: Jared Carlson, male    DOB: [birth year]  Age: 23 y.o. MRN: [id number]  CC:  Chief Complaint  Patient presents with  . Hospitalization Follow-up    DVT    HPI Jared Carlson presents for a hospital follow-up and no primary care on file.  Patient presented to the emergency room on February 22, 2019 with complaints of left leg pain and cramps that had been taking place for the last 5 days.  He noticed that when he was trying to walk or stand up sharp pains migrated to his left calf causing him difficulty to walk.   Past Medical History:  Diagnosis Date  . DVT (deep venous thrombosis) (HCC)     History reviewed. No pertinent surgical history.  Family History  Problem Relation Age of Onset  . Healthy Mother   . Healthy Father     Social History   Socioeconomic History  . Marital status: Single    Spouse name: Not on file  . Number of children: Not on file  . Years of education: Not on file  . Highest education level: Not on file  Occupational History  . Not on file  Social Needs  . Financial resource strain: Not on file  . Food insecurity    Worry: Not on file    Inability: Not on file  . Transportation needs    Medical: Not on file    Non-medical: Not on file  Tobacco Use  . Smoking status: Never Smoker  . Smokeless tobacco: Never Used  Substance and Sexual Activity  . Alcohol use: Never    Frequency: Never  . Drug use: Never  . Sexual activity: Not on file  Lifestyle  . Physical activity    Days per week: Not on file    Minutes per session: Not on file  . Stress: Not on file  Relationships  . Social Musicianconnections    Talks on phone: Not on file    Gets together: Not on file    Attends religious service: Not on file    Active member of club or organization: Not on file    Attends meetings of clubs or organizations: Not on file    Relationship status: Not on file  . Intimate partner violence    Fear of  current or ex partner: Not on file    Emotionally abused: Not on file    Physically abused: Not on file    Forced sexual activity: Not on file  Other Topics Concern  . Not on file  Social History Narrative  . Not on file    ROS Review of Systems  All other systems reviewed and are negative.   Objective:   Today's Vitals: BP 115/73 (BP Location: Left Arm, Patient Position: Sitting, Cuff Size: Large)   Pulse 75   Temp 98.2 F (36.8 C) (Tympanic)   Ht 5\' 9"  (1.753 m)   Wt 259 lb 6.4 oz (117.7 kg)   SpO2 96%   BMI 38.31 kg/m   Physical Exam Constitutional:      Appearance: Normal appearance.  HENT:     Head: Normocephalic.     Right Ear: Tympanic membrane normal.     Left Ear: Tympanic membrane normal.  Eyes:     Extraocular Movements: Extraocular movements intact.     Pupils: Pupils are equal, round, and reactive to light.  Neck:     Musculoskeletal: Normal range of motion and  neck supple.  Cardiovascular:     Rate and Rhythm: Normal rate and regular rhythm.  Pulmonary:     Effort: Pulmonary effort is normal.     Breath sounds: Normal breath sounds.  Abdominal:     General: Abdomen is flat. Bowel sounds are normal. There is distension.     Palpations: Abdomen is soft.  Musculoskeletal: Normal range of motion.        General: Swelling present.  Skin:    General: Skin is warm and dry.  Neurological:     General: No focal deficit present.     Mental Status: He is alert and oriented to person, place, and time.  Psychiatric:        Mood and Affect: Mood normal.     Assessment & Plan:  Jared Carlson was seen today for hospitalization follow-up.  Diagnoses and all orders for this visit:  Hospital discharge follow-up Jared Carlson presented to the emergency room on August 27 for acute deep vein thrombosis of the tibial vein of the left lower extremity.  He presented again to the emergency room on August 30 reviewing the blood pressure 178/84 this visit encounter was for  pain he received Percocet 5/325 and a prescription for quantity of 15.  Encounter for medical examination to establish care Gwinda Passe, NP-C will be your  (PCP) that will  provides both the first contact for a person with an undiagnosed health concern as well as continuing care of varied medical conditions, not limited by cause, organ system, or diagnosis.   Acute deep vein thrombosis (DVT) of proximal vein of left lower extremity [facility]) Jared Carlson is very clear that he has developed a blood clot within the deep vein and inflammation has occurred in the vessel walls which is the source of his pain.  He is aware that he will be on Xarelto for at least the next 3 months.  Adult BMI 38.0-38.9 kg/sq m Obesity is 30-39 indicating an excess in caloric intake or underlining conditions. This may lead to other co-morbidities. Lifestyle modifications of diet and exercise may reduce obesity.   Screening for HIV (human immunodeficiency virus) -     HIV antibody (with reflex); Future  Pain and swelling in the left lower leg Secondary to DVT reviewed [facility] will prescribe short-term narcotics with no refills. Other orders -     HYDROcodone-acetaminophen (NORCO) 7.5-325 MG tablet; Take 1 tablet by mouth every 6 (six) hours as needed for moderate pain.     Outpatient Encounter Medications as of 03/14/2019  Medication Sig  . methocarbamol (ROBAXIN-750) 750 MG tablet Take 1 tablet (750 mg total) by mouth 4 (four) times daily.  Marland Kitchen oxyCODONE-acetaminophen (PERCOCET/ROXICET) 5-325 MG tablet Take 2 tablets by mouth every 4 (four) hours as needed for severe pain.  . Rivaroxaban 15 & 20 MG TBPK Take as directed on package: Start with one 15mg  tablet by mouth twice a day with food. On Day 22, switch to one 20mg  tablet once a day with food.  Marland Kitchen HYDROcodone-acetaminophen (NORCO) 7.5-325 MG tablet Take 1 tablet by mouth every 6 (six) hours as needed for moderate pain.  Melene Muller ON 03/25/2019] rivaroxaban  (XARELTO) 20 MG TABS tablet Take 1 tablet (20 mg total) by mouth daily with supper. For use after pt has completed starter pack (Patient not taking: Reported on 03/14/2019)  . [DISCONTINUED] HYDROcodone-acetaminophen (NORCO) 5-325 MG tablet Take 1 tablet by mouth every 6 (six) hours as needed.   No facility-administered encounter medications on  file as of 03/14/2019.     Follow-up: Return in about 3 months (around 06/13/2019) for HTN, obesity .   Kerin Perna, NP

## 2019-03-23 ENCOUNTER — Inpatient Hospital Stay: Payer: Self-pay | Admitting: Family Medicine

## 2019-06-13 ENCOUNTER — Ambulatory Visit (INDEPENDENT_AMBULATORY_CARE_PROVIDER_SITE_OTHER): Payer: Self-pay | Admitting: Primary Care

## 2020-06-11 ENCOUNTER — Emergency Department (HOSPITAL_COMMUNITY)
Admission: EM | Admit: 2020-06-11 | Discharge: 2020-06-11 | Disposition: A | Discharge status: Home / Self Care | Attending: Emergency Medicine | Admitting: Emergency Medicine

## 2020-06-11 ENCOUNTER — Other Ambulatory Visit: Payer: Self-pay

## 2020-06-11 ENCOUNTER — Encounter (HOSPITAL_COMMUNITY): Payer: Self-pay

## 2020-06-11 ENCOUNTER — Emergency Department (HOSPITAL_COMMUNITY)

## 2020-06-11 ENCOUNTER — Emergency Department (HOSPITAL_BASED_OUTPATIENT_CLINIC_OR_DEPARTMENT_OTHER)

## 2020-06-11 DIAGNOSIS — M7989 Other specified soft tissue disorders: Secondary | ICD-10-CM | POA: Diagnosis not present

## 2020-06-11 DIAGNOSIS — R55 Syncope and collapse: Secondary | ICD-10-CM | POA: Diagnosis not present

## 2020-06-11 DIAGNOSIS — Z86718 Personal history of other venous thrombosis and embolism: Secondary | ICD-10-CM

## 2020-06-11 DIAGNOSIS — M79662 Pain in left lower leg: Secondary | ICD-10-CM | POA: Diagnosis present

## 2020-06-11 DIAGNOSIS — R0602 Shortness of breath: Secondary | ICD-10-CM

## 2020-06-11 DIAGNOSIS — Z7901 Long term (current) use of anticoagulants: Secondary | ICD-10-CM | POA: Insufficient documentation

## 2020-06-11 LAB — CBC
HCT: 47.1 % (ref 39.0–52.0)
Hemoglobin: 16.6 g/dL (ref 13.0–17.0)
MCH: 31.1 pg (ref 26.0–34.0)
MCHC: 35.2 g/dL (ref 30.0–36.0)
MCV: 88.4 fL (ref 80.0–100.0)
Platelets: 204 10*3/uL (ref 150–400)
RBC: 5.33 MIL/uL (ref 4.22–5.81)
RDW: 13.4 % (ref 11.5–15.5)
WBC: 10.5 10*3/uL (ref 4.0–10.5)
nRBC: 0 % (ref 0.0–0.2)

## 2020-06-11 LAB — BASIC METABOLIC PANEL
Anion gap: 14 (ref 5–15)
BUN: 12 mg/dL (ref 6–20)
CO2: 21 mmol/L — ABNORMAL LOW (ref 22–32)
Calcium: 9.7 mg/dL (ref 8.9–10.3)
Chloride: 102 mmol/L (ref 98–111)
Creatinine, Ser: 1.02 mg/dL (ref 0.61–1.24)
GFR, Estimated: >60 mL/min (ref >60)
Glucose, Bld: 96 mg/dL (ref 70–99)
Potassium: 3.5 mmol/L (ref 3.5–5.1)
Sodium: 137 mmol/L (ref 135–145)

## 2020-06-11 MED ORDER — SODIUM CHLORIDE (PF) 0.9 % IJ SOLN
INTRAMUSCULAR | Status: AC
  Filled 2020-06-11: qty 50

## 2020-06-11 MED ORDER — KETOROLAC TROMETHAMINE 15 MG/ML IJ SOLN
15.0000 mg | Freq: Once | INTRAMUSCULAR | Status: AC
  Administered 2020-06-11: 15 mg via INTRAVENOUS
  Filled 2020-06-11: qty 1

## 2020-06-11 MED ORDER — NAPROXEN 375 MG PO TABS: 375.0000 mg | ORAL_TABLET | Freq: Two times a day (BID) | ORAL | 0 refills | Status: DC | PRN

## 2020-06-11 MED ORDER — IOHEXOL 350 MG/ML SOLN
100.0000 mL | Freq: Once | INTRAVENOUS | Status: AC | PRN
  Administered 2020-06-11: 68 mL via INTRAVENOUS

## 2020-06-11 MED ORDER — MORPHINE SULFATE (PF) 4 MG/ML IV SOLN
4.0000 mg | Freq: Once | INTRAVENOUS | Status: AC
  Administered 2020-06-11: 4 mg via INTRAVENOUS
  Filled 2020-06-11: qty 1

## 2020-06-11 NOTE — ED Provider Notes (Signed)
Assumed care from Dr. Preston Fleeting at 7:30 AM. Patient CT angio was negative for PE and ultrasound is negative for DVT. Patient was given this information. He has been having pain in his left leg which is worse when he walks. He does not know of any new acute injury. Could be strain but he is not displaying any signs concerning for tendon injury. We will have him use Tylenol and ibuprofen as needed. Patient given a dose of Toradol here before discharge.   Gwyneth Sprout, MD 06/11/20 [phone]

## 2020-06-11 NOTE — Discharge Instructions (Signed)
No sign of blood clot today in your legs or in your lungs. Use the naproxen and Tylenol as needed for pain for the next few days. Do not do any strenuous activity. Make sure you are eating and drinking regularly. If symptoms do not improve you will need to follow-up with your doctor.

## 2020-06-11 NOTE — Progress Notes (Signed)
Left lower extremity venous study completed.     Please see CV Proc for preliminary results.   Nabil Bubolz, RVT  

## 2020-06-11 NOTE — ED Triage Notes (Signed)
Patient reports leg pain  And swelling that has been ongoing since this afternoon. He also reports one episode of syncope, pt states he blacked out and fell onto the bed in his cell. Pt reports hx of blood clots in his leg starting last year. Was taking Xarelto but has now since completed its course.

## 2020-06-11 NOTE — ED Provider Notes (Signed)
[facility]-EMERGENCY DEPT Provider Note   CSN: [id number] Arrival date & time: 06/11/20  0204   History Chief Complaint  Patient presents with  . Leg Pain    Jared Carlson is a 24 y.o. male.  The history is provided by the patient.  Leg Pain He has history of DVT and had onset yesterday afternoon of pain in his left calf which is similar to what he had with DVT in the past.  He states that he has had 2 courses of rivaroxaban because of DVT.  He denies any recent trauma.  He rates his pain at 9/10.  He had a syncopal episode which she feels was related to pain.  He has also noted some shortness of breath.  He denies any chest pain.  Past Medical History:  Diagnosis Date  . DVT (deep venous thrombosis) (HCC)     There are no problems to display for this patient.   History reviewed. No pertinent surgical history.     Family History  Problem Relation Age of Onset  . Healthy Mother   . Healthy Father     Social History   Tobacco Use  . Smoking status: Never Smoker  . Smokeless tobacco: Never Used  Vaping Use  . Vaping Use: Never used  Substance Use Topics  . Alcohol use: Never  . Drug use: Never    Home Medications Prior to Admission medications   Medication Sig Start Date End Date Taking? Authorizing Provider  HYDROcodone-acetaminophen (NORCO) 7.5-325 MG tablet Take 1 tablet by mouth every 6 (six) hours as needed for moderate pain. 03/14/19   Grayce Sessions, NP  methocarbamol (ROBAXIN-750) 750 MG tablet Take 1 tablet (750 mg total) by mouth 4 (four) times daily. 02/25/19   Lorre Nick, MD  oxyCODONE-acetaminophen (PERCOCET/ROXICET) 5-325 MG tablet Take 2 tablets by mouth every 4 (four) hours as needed for severe pain. 02/25/19   Lorre Nick, MD  rivaroxaban (XARELTO) 20 MG TABS tablet Take 1 tablet (20 mg total) by mouth daily with supper. For use after pt has completed starter pack Patient not taking: Reported on 03/14/2019 03/25/19    Dartha Lodge, PA-C  Rivaroxaban 15 & 20 MG TBPK Take as directed on package: Start with one 15mg  tablet by mouth twice a day with food. On Day 22, switch to one 20mg  tablet once a day with food. 02/22/19   , PA-C    Allergies    Patient has no known allergies.  Review of Systems   Review of Systems  All other systems reviewed and are negative.   Physical Exam Updated Vital Signs BP (!) 167/91 (BP Location: Left Arm)   Pulse 84   Temp 98.6 F (37 C) (Oral)   Resp 19   Ht 5\' 9"  (1.753 m)   Wt 113.4 kg   SpO2 97%   BMI 36.92 kg/m   Physical Exam Vitals and nursing note reviewed.   25 year old male, resting comfortably and in no acute distress. Vital signs are significant for elevated blood pressure. Oxygen saturation is 87%, which is normal. Head is normocephalic and atraumatic. PERRLA, EOMI. Oropharynx is clear. Neck is nontender and supple without adenopathy or JVD. Back is nontender and there is no CVA tenderness. Lungs are clear without rales, wheezes, or rhonchi. Chest is nontender. Heart has regular rate and rhythm without murmur. Abdomen is soft, flat, nontender without masses or hepatosplenomegaly and peristalsis is normoactive. Extremities: Moderate tenderness left calf without  palpable cord.  Positive Homans' sign on the left.  Calf circumference is symmetric. Skin is warm and dry without rash. Neurologic: Mental status is normal, cranial nerves are intact, there are no motor or sensory deficits.  ED Results / Procedures / Treatments   Labs (all labs ordered are listed, but only abnormal results are displayed) Labs Reviewed  BASIC METABOLIC PANEL - Abnormal; Notable for the following components:      Result Value   CO2 21 (*)    All other components within normal limits  CBC    EKG EKG Interpretation  Date/Time:  Wednesday June 11 2020 02:47:53 EST Ventricular Rate:  86 PR Interval:    QRS Duration: 95 QT Interval:  355 QTC  Calculation: 425 R Axis:   80 Text Interpretation: Sinus rhythm ST elevation multiple leads, probable early repolarization Confirmed by Dione Booze ([id number]) on 06/11/2020 3:01:09 AM  Procedures Procedures  Medications Ordered in ED Medications  morphine 4 MG/ML injection 4 mg (has no administration in time range)    ED Course  I have reviewed the triage vital signs and the nursing notes.  Pertinent labs & imaging results that were available during my care of the patient were reviewed by me and considered in my medical decision making (see chart for details).  MDM Rules/Calculators/A&P Left calf pain with syncopal episode.  Labs are reassuring, ECG shows early repolarization.  Old records are reviewed and I see 1 prior episode of DVT.  Venous Dopplers are ordered.  Because of dyspnea with known history of DVT, he will be sent for CT angiogram of the chest to rule out pulmonary embolism.  Labs are unremarkable.  CT angiogram and venous Doppler pending.  Case is signed out to Dr. Anitra Lauth.  Final Clinical Impression(s) / ED Diagnoses Final diagnoses:  Pain of left calf  History of DVT of lower extremity  Shortness of breath  Syncope, unspecified syncope type    Rx / DC Orders ED Discharge Orders    None       Dione Booze, MD 06/11/20 [phone]

## 2020-06-14 ENCOUNTER — Observation Stay (HOSPITAL_COMMUNITY)
Admission: EM | Admit: 2020-06-14 | Discharge: 2020-06-17 | Disposition: A | Discharge status: Home / Self Care | Attending: Internal Medicine | Admitting: Internal Medicine

## 2020-06-14 ENCOUNTER — Other Ambulatory Visit: Payer: Self-pay

## 2020-06-14 ENCOUNTER — Encounter (HOSPITAL_COMMUNITY): Payer: Self-pay | Admitting: Emergency Medicine

## 2020-06-14 DIAGNOSIS — R55 Syncope and collapse: Secondary | ICD-10-CM | POA: Diagnosis present

## 2020-06-14 DIAGNOSIS — Z20822 Contact with and (suspected) exposure to covid-19: Secondary | ICD-10-CM | POA: Insufficient documentation

## 2020-06-14 DIAGNOSIS — R29898 Other symptoms and signs involving the musculoskeletal system: Secondary | ICD-10-CM | POA: Diagnosis not present

## 2020-06-14 LAB — BASIC METABOLIC PANEL
Anion gap: 15 (ref 5–15)
BUN: 9 mg/dL (ref 6–20)
CO2: 19 mmol/L — ABNORMAL LOW (ref 22–32)
Calcium: 9.4 mg/dL (ref 8.9–10.3)
Chloride: 103 mmol/L (ref 98–111)
Creatinine, Ser: 1.28 mg/dL — ABNORMAL HIGH (ref 0.61–1.24)
GFR, Estimated: >60 mL/min (ref >60)
Glucose, Bld: 88 mg/dL (ref 70–99)
Potassium: 3.5 mmol/L (ref 3.5–5.1)
Sodium: 137 mmol/L (ref 135–145)

## 2020-06-14 LAB — CBC
HCT: 46.6 % (ref 39.0–52.0)
Hemoglobin: 15.7 g/dL (ref 13.0–17.0)
MCH: 30.1 pg (ref 26.0–34.0)
MCHC: 33.7 g/dL (ref 30.0–36.0)
MCV: 89.4 fL (ref 80.0–100.0)
Platelets: 198 10*3/uL (ref 150–400)
RBC: 5.21 MIL/uL (ref 4.22–5.81)
RDW: 13 % (ref 11.5–15.5)
WBC: 7.4 10*3/uL (ref 4.0–10.5)
nRBC: 0 % (ref 0.0–0.2)

## 2020-06-14 NOTE — ED Notes (Signed)
Pt called again for VS and again by lab, no response, not visualized in lobby.

## 2020-06-14 NOTE — ED Triage Notes (Signed)
Pt [facility] tri[facility].  Reports syncopal episode, headache, and decreased sensation to bilateral lower extremities.  Per EMS pt reports syncopal episode and was found in next cell over from his without clothes on. No weakness.  CBG 90.

## 2020-06-14 NOTE — ED Notes (Signed)
Pt called x2 for VS with no response.

## 2020-06-15 ENCOUNTER — Emergency Department (HOSPITAL_COMMUNITY)

## 2020-06-15 ENCOUNTER — Encounter (HOSPITAL_COMMUNITY): Payer: Self-pay | Admitting: Neurology

## 2020-06-15 ENCOUNTER — Observation Stay (HOSPITAL_COMMUNITY)

## 2020-06-15 DIAGNOSIS — R29898 Other symptoms and signs involving the musculoskeletal system: Secondary | ICD-10-CM | POA: Diagnosis present

## 2020-06-15 DIAGNOSIS — G8220 Paraplegia, unspecified: Secondary | ICD-10-CM

## 2020-06-15 DIAGNOSIS — R569 Unspecified convulsions: Secondary | ICD-10-CM | POA: Diagnosis not present

## 2020-06-15 DIAGNOSIS — R449 Unspecified symptoms and signs involving general sensations and perceptions: Secondary | ICD-10-CM | POA: Diagnosis not present

## 2020-06-15 LAB — CBC WITH DIFFERENTIAL/PLATELET
Abs Immature Granulocytes: 0.02 10*3/uL (ref 0.00–0.07)
Basophils Absolute: 0 10*3/uL (ref 0.0–0.1)
Basophils Relative: 0 %
Eosinophils Absolute: 0.1 10*3/uL (ref 0.0–0.5)
Eosinophils Relative: 1 %
HCT: 42.4 % (ref 39.0–52.0)
Hemoglobin: 14.1 g/dL (ref 13.0–17.0)
Immature Granulocytes: 0 %
Lymphocytes Relative: 26 %
Lymphs Abs: 2.2 10*3/uL (ref 0.7–4.0)
MCH: 30.3 pg (ref 26.0–34.0)
MCHC: 33.3 g/dL (ref 30.0–36.0)
MCV: 91 fL (ref 80.0–100.0)
Monocytes Absolute: 1.1 10*3/uL — ABNORMAL HIGH (ref 0.1–1.0)
Monocytes Relative: 12 %
Neutro Abs: 5.2 10*3/uL (ref 1.7–7.7)
Neutrophils Relative %: 61 %
Platelets: 128 10*3/uL — ABNORMAL LOW (ref 150–400)
RBC: 4.66 MIL/uL (ref 4.22–5.81)
RDW: 13 % (ref 11.5–15.5)
WBC: 8.6 10*3/uL (ref 4.0–10.5)
nRBC: 0 % (ref 0.0–0.2)

## 2020-06-15 LAB — URINALYSIS, ROUTINE W REFLEX MICROSCOPIC
Bilirubin Urine: NEGATIVE
Glucose, UA: NEGATIVE mg/dL
Hgb urine dipstick: NEGATIVE
Ketones, ur: 20 mg/dL — AB
Leukocytes,Ua: NEGATIVE
Nitrite: NEGATIVE
Protein, ur: 30 mg/dL — AB
Specific Gravity, Urine: 1.026 (ref 1.005–1.030)
pH: 5 (ref 5.0–8.0)

## 2020-06-15 LAB — COMPREHENSIVE METABOLIC PANEL
ALT: 18 U/L (ref 0–44)
AST: 19 U/L (ref 15–41)
Albumin: 3.7 g/dL (ref 3.5–5.0)
Alkaline Phosphatase: 94 U/L (ref 38–126)
Anion gap: 15 (ref 5–15)
BUN: 11 mg/dL (ref 6–20)
CO2: 19 mmol/L — ABNORMAL LOW (ref 22–32)
Calcium: 8.8 mg/dL — ABNORMAL LOW (ref 8.9–10.3)
Chloride: 101 mmol/L (ref 98–111)
Creatinine, Ser: 1.12 mg/dL (ref 0.61–1.24)
GFR, Estimated: >60 mL/min (ref >60)
Glucose, Bld: 83 mg/dL (ref 70–99)
Potassium: 3.6 mmol/L (ref 3.5–5.1)
Sodium: 135 mmol/L (ref 135–145)
Total Bilirubin: 2.4 mg/dL — ABNORMAL HIGH (ref 0.3–1.2)
Total Protein: 6.6 g/dL (ref 6.5–8.1)

## 2020-06-15 LAB — LIPID PANEL
Cholesterol: 137 mg/dL (ref 0–200)
HDL: 29 mg/dL — ABNORMAL LOW (ref >40)
LDL Cholesterol: 95 mg/dL (ref 0–99)
Total CHOL/HDL Ratio: 4.7 RATIO
Triglycerides: 67 mg/dL (ref <150)
VLDL: 13 mg/dL (ref 0–40)

## 2020-06-15 LAB — TSH: TSH: 1.595 u[IU]/mL (ref 0.350–4.500)

## 2020-06-15 LAB — RESP PANEL BY RT-PCR (FLU A&B, COVID) ARPGX2
Influenza A by PCR: NEGATIVE
Influenza B by PCR: NEGATIVE
SARS Coronavirus 2 by RT PCR: NEGATIVE

## 2020-06-15 LAB — HIV ANTIBODY (ROUTINE TESTING W REFLEX): HIV Screen 4th Generation wRfx: NONREACTIVE

## 2020-06-15 LAB — PHOSPHORUS: Phosphorus: 4 mg/dL (ref 2.5–4.6)

## 2020-06-15 LAB — HEMOGLOBIN A1C
Hgb A1c MFr Bld: 5 % (ref 4.8–5.6)
Mean Plasma Glucose: 96.8 mg/dL

## 2020-06-15 LAB — MAGNESIUM: Magnesium: 1.9 mg/dL (ref 1.7–2.4)

## 2020-06-15 LAB — VITAMIN B12: Vitamin B-12: 1439 pg/mL — ABNORMAL HIGH (ref 180–914)

## 2020-06-15 MED ORDER — SODIUM CHLORIDE 0.9 % IV BOLUS
1000.0000 mL | Freq: Once | INTRAVENOUS | Status: AC
  Administered 2020-06-15: 1000 mL via INTRAVENOUS

## 2020-06-15 MED ORDER — ACETAMINOPHEN 650 MG RE SUPP: 650.0000 mg | Freq: Four times a day (QID) | RECTAL | Status: DC | PRN

## 2020-06-15 MED ORDER — LORAZEPAM 2 MG/ML IJ SOLN
1.0000 mg | Freq: Once | INTRAMUSCULAR | Status: AC
  Administered 2020-06-15: 1 mg via INTRAVENOUS
  Filled 2020-06-15: qty 1

## 2020-06-15 MED ORDER — ENOXAPARIN SODIUM 40 MG/0.4ML ~~LOC~~ SOLN
40.0000 mg | SUBCUTANEOUS | Status: DC
  Administered 2020-06-15 – 2020-06-16 (×2): 40 mg via SUBCUTANEOUS
  Filled 2020-06-15 (×3): qty 0.4

## 2020-06-15 MED ORDER — ACETAMINOPHEN 325 MG PO TABS
650.0000 mg | ORAL_TABLET | Freq: Four times a day (QID) | ORAL | Status: DC | PRN
  Administered 2020-06-15: 650 mg via ORAL
  Filled 2020-06-15: qty 2

## 2020-06-15 MED ORDER — IOHEXOL 300 MG/ML  SOLN
100.0000 mL | Freq: Once | INTRAMUSCULAR | Status: AC | PRN
  Administered 2020-06-15: 100 mL via INTRAVENOUS

## 2020-06-15 NOTE — ED Notes (Signed)
Mri will call 30 minutes before they are going to scan him  The pt reports that he will need a little medicine

## 2020-06-15 NOTE — H&P (Addendum)
Date: 06/15/2020               Patient Name:  Jared Carlson MRN: [id number]  DOB: May 06, 1996 Age / Sex: 24 y.o., male   PCP: Grayce Sessions, NP         Medical Service: Internal Medicine Teaching Service         Attending Physician: Dr. Jacalyn Lefevre, MD    First Contact: Kirke Corin, MD, [place]  Pager: [place] [phone])  Second Contact: Drema Halon Pager: [facility] [phone])       After Hours (After 5p/  First Contact Pager: [phone]  weekends / holidays): Second Contact Pager: [phone]   Chief Complaint: Lower extremity weakness/numbess  History of Present Illness:   [name] is a 24 year old male with a pertinent past medical history of DVTs not on anticoagulation who was transported from prison for syncopal event.  Patient states that yesterday he started having feelings of lightheadedness, anxiety, and vision changes.  He tried to alert the prison guards regarding his symptoms. When they came to evaluate him he was laying on the floor in an awkward position. He was able to be aroused by the guards, but does not know how long he was out. When he woke up he had a throbbing headache that was worse on the left fronto-temporal area and lower extremity numbness and weakness. He admits to having previous syncopal events, but has never experienced numbness and weakness such as this. In the ED he continued to have a headache and admits to two additional episode of presyncopal symptoms, which were not witnessed. He denied any additional complaints such as chest pain, SHOB, nausea, vomiting, diarrhea, fevers, chills.  No recent medication change or substance use.    Lab Orders     Resp Panel by RT-PCR (Flu A&B, Covid) Nasopharyngeal Swab     Basic metabolic panel     CBC     Urinalysis, Routine w reflex microscopic     CBG monitoring, ED   Meds:  No outpatient medications have been marked as taking for the 06/14/20 encounter [facility] Encounter).    Social: Patient is  an inmate  Family History:  Family History  Problem Relation Age of Onset  . Healthy Mother   . Healthy Father      Allergies: Allergies as of 06/14/2020  . (No Known Allergies)   Past Medical History:  Diagnosis Date  . DVT (deep venous thrombosis) (HCC)      Review of Systems: A complete ROS was negative except as per HPI.   Physical Exam: Blood pressure 126/82, pulse (!) 106, temperature 98.7 F (37.1 C), temperature source Oral, resp. rate 18, SpO2 100 %. Physical Exam Constitutional:      General: He is in acute distress.     Appearance: Normal appearance. He is not ill-appearing.  HENT:     Head: Normocephalic and atraumatic.     Mouth/Throat:     Mouth: Mucous membranes are moist.  Eyes:     Extraocular Movements: Extraocular movements intact.     Pupils: Pupils are equal, round, and reactive to light.  Cardiovascular:     Rate and Rhythm: Normal rate and regular rhythm.     Pulses: Normal pulses.     Heart sounds: Normal heart sounds.  Pulmonary:     Effort: Pulmonary effort is normal.     Breath sounds: Normal breath sounds.  Abdominal:     General: Bowel sounds are normal. There is no distension.  Palpations: Abdomen is soft.     Tenderness: There is no abdominal tenderness.  Musculoskeletal:        General: No swelling, tenderness or deformity.     Cervical back: Normal range of motion.     Comments: Full active and passive range of motion bilaterally.  Range of motion limited in the lower extremities due to weakness  Skin:    General: Skin is warm and dry.  Neurological:     Mental Status: He is alert and oriented to person, place, and time.     Cranial Nerves: No cranial nerve deficit.     Sensory: Sensory deficit (patient endorses numbness and parethesias form the knees downward) present.     Motor: Weakness (patient has 0/5 weakness inhis bilateral lower extremities, able to sit up without assistance (5/5 strength of sacroilliacs), but  paradoxically is not able to bed legs at the waits) present.     Coordination: Coordination is intact.     Deep Tendon Reflexes:     Reflex Scores:      Bicep reflexes are 2+ on the right side and 2+ on the left side.      Brachioradialis reflexes are 2+ on the right side and 2+ on the left side.      Patellar reflexes are 2+ on the right side and 2+ on the left side.    Labs: CBC    Component Value Date/Time   WBC 7.4 06/14/2020 1854   RBC 5.21 06/14/2020 1854   HGB 15.7 06/14/2020 1854   HCT 46.6 06/14/2020 1854   PLT 198 06/14/2020 1854   MCV 89.4 06/14/2020 1854   MCH 30.1 06/14/2020 1854   MCHC 33.7 06/14/2020 1854   RDW 13.0 06/14/2020 1854   LYMPHSABS 2.1 02/22/2019 1040   MONOABS 1.1 (H) 02/22/2019 1040   EOSABS 0.2 02/22/2019 1040   BASOSABS 0.0 02/22/2019 1040     CMP     Component Value Date/Time   NA 137 06/14/2020 1854   K 3.5 06/14/2020 1854   CL 103 06/14/2020 1854   CO2 19 (L) 06/14/2020 1854   GLUCOSE 88 06/14/2020 1854   BUN 9 06/14/2020 1854   CREATININE 1.28 (H) 06/14/2020 1854   CALCIUM 9.4 06/14/2020 1854   GFRNONAA >60 06/14/2020 1854   GFRAA >60 02/22/2019 1040    Imaging: CT Head Wo Contrast  Result Date: 06/15/2020 CLINICAL DATA:  Trauma.  Altered mental status.  Syncopal episode. EXAM: CT HEAD WITHOUT CONTRAST CT CERVICAL SPINE WITHOUT CONTRAST TECHNIQUE: Multidetector CT imaging of the head and cervical spine was performed following the standard protocol without intravenous contrast. Multiplanar CT image reconstructions of the cervical spine were also generated. COMPARISON:  None. FINDINGS: CT HEAD FINDINGS Brain: No evidence of acute infarction, hemorrhage, hydrocephalus, extra-axial collection or mass lesion/mass effect. Vascular: No hyperdense vessel. Skull: No acute fracture Sinuses/Orbits: Visualized sinuses are clear. Other: No mastoid effusions. CT CERVICAL SPINE FINDINGS Alignment: Mild reversal of the normal cervical lordosis. No  substantial subluxation. Skull base and vertebrae: No evidence of acute fracture. Vertebral body heights are maintained. Small degenerative Schmorl node involving the superior T1 endplate. Soft tissues and spinal canal: No prevertebral fluid or swelling. No visible canal hematoma. Disc levels: Mild multilevel degenerative disc disease without evidence of high-grade bony canal stenosis. Right eccentric posterior disc osteophyte complex at C2-C3. Upper chest: Negative. IMPRESSION: 1. No evidence of acute intracranial abnormality. 2. No evidence of acute fracture or traumatic malalignment in the cervical spine. Electronically  Signed   By: Feliberto Harts MD   On: 06/15/2020 12:08   CT Cervical Spine Wo Contrast  Result Date: 06/15/2020 CLINICAL DATA:  Trauma.  Altered mental status.  Syncopal episode. EXAM: CT HEAD WITHOUT CONTRAST CT CERVICAL SPINE WITHOUT CONTRAST TECHNIQUE: Multidetector CT imaging of the head and cervical spine was performed following the standard protocol without intravenous contrast. Multiplanar CT image reconstructions of the cervical spine were also generated. COMPARISON:  None. FINDINGS: CT HEAD FINDINGS Brain: No evidence of acute infarction, hemorrhage, hydrocephalus, extra-axial collection or mass lesion/mass effect. Vascular: No hyperdense vessel. Skull: No acute fracture Sinuses/Orbits: Visualized sinuses are clear. Other: No mastoid effusions. CT CERVICAL SPINE FINDINGS Alignment: Mild reversal of the normal cervical lordosis. No substantial subluxation. Skull base and vertebrae: No evidence of acute fracture. Vertebral body heights are maintained. Small degenerative Schmorl node involving the superior T1 endplate. Soft tissues and spinal canal: No prevertebral fluid or swelling. No visible canal hematoma. Disc levels: Mild multilevel degenerative disc disease without evidence of high-grade bony canal stenosis. Right eccentric posterior disc osteophyte complex at C2-C3. Upper  chest: Negative. IMPRESSION: 1. No evidence of acute intracranial abnormality. 2. No evidence of acute fracture or traumatic malalignment in the cervical spine. Electronically Signed   By: Feliberto Harts MD   On: 06/15/2020 12:08   MR THORACIC SPINE WO CONTRAST  Result Date: 06/15/2020 CLINICAL DATA:  Reported history is cord compression. EXAM: MRI THORACIC SPINE WITHOUT CONTRAST TECHNIQUE: Multiplanar, multisequence MR imaging of the thoracic spine was performed. No intravenous contrast was administered. COMPARISON:  None. FINDINGS: Alignment:  Physiologic. Vertebrae: Vertebral body heights are maintained. No focal marrow signal abnormality to suggest acute fracture, discitis/osteomyelitis, or suspicious bone lesion. Cord:  Normal signal and morphology. Paraspinal and other soft tissues: Unremarkable. Disc levels: There is a left eccentric posterior disc bulge at T9-T10 which effaces ventral CSF without significant canal stenosis. There is multilevel facet hypertrophy with resulting multilevel mild foraminal stenosis on the left in the lower thoracic spine. IMPRESSION: Mild multilevel degenerative change small disc bulge at T9-T10 and mild multilevel foraminal stenosis in the lower thoracic spine. No significant canal stenosis. Electronically Signed   By: Feliberto Harts MD   On: 06/15/2020 10:39   MR LUMBAR SPINE WO CONTRAST  Result Date: 06/15/2020 CLINICAL DATA:  Low back pain, progressive neurologic deficit. EXAM: MRI LUMBAR SPINE WITHOUT CONTRAST TECHNIQUE: Multiplanar, multisequence MR imaging of the lumbar spine was performed. No intravenous contrast was administered. COMPARISON:  None. FINDINGS: Segmentation: The inferior-most fully formed intervertebral disc is labeled L5-S1. Alignment:  Physiologic. Vertebrae: Vertebral body heights are maintained. No focal marrow signal abnormality to suggest acute fracture, discitis/osteomyelitis, or suspicious bone lesion. Conus medullaris and cauda  equina: Conus extends to the L1-L2 level. Conus and cauda equina appear normal. Paraspinal and other soft tissues: Unremarkable. Disc levels: T12-L1: No significant disc protrusion, foraminal stenosis, or canal stenosis. L1-L2: Mild bilateral facet hypertrophy and ligamentum flavum thickening. No significant canal or foraminal stenosis. L2-L3: Mild disc bulge, mild bilateral facet hypertrophy, and ligamentum flavum thickening. No significant canal stenosis. Mild left foraminal stenosis. L3-L4: Mild disc bulge, bilateral facet hypertrophy. No significant canal stenosis. Mild bilateral foraminal stenosis. L4-L5: Mild disc bulge and mild bilateral facet hypertrophy. No significant canal stenosis. Mild bilateral foraminal stenosis. L5-S1: No significant disc protrusion, foraminal stenosis, or canal stenosis. IMPRESSION: Mild multilevel foraminal stenosis, as detailed above. No significant canal stenosis. Electronically Signed   By: Feliberto Harts MD   On: 06/15/2020  10:46    EKG: personally reviewed my interpretation is sinus rhythm with J-point elevations in the anterior leads and possible U waves in the inferior leads  Assessment & Plan by Problem: Active Problems:   * No active hospital problems. *   Joaquin MusicGabriel Cammon is a 24 y.o. with pertinent PMH of DVT who presented with syncope and lower extremity weakness and admit for further evaluation and management on hospital day 0  #Syncope: Patient presents to the ED with syncopal episode. His syncopal event is likely secondary to reflex vs orthostasis.  This is strengthened by patient's prodromal symptoms leading up to the event.  I will get orthostatic vitals with him laying flat then sitting up to assess for orthostatic hypotension.  Patient denies any illicit drug use or current medications, which makes iatrogenic causes of syncope unlikely.  Furthermore he is unlikely to have structural heart disease considering his young age and is currently in sinus  rhythm here on telemetry.  We will continue to monitor him for any signs of arrhythmias that could have caused his syncopal episode. He has no history of seizures in the past and denies any post ictal symptoms. Physical exam is inconsistent with seizure activity, we may consider EEG if all other work-up is negative. CT of the head was negative for intracranial abnormalities, and we are waiting on a follow-up MRI of brain to rule out stroke and MS.  His blood sugar was normal on admission making hypoglycemia less likely. -Orthostatic vitals -MRI of brain with and without contrast -EEG if all other work-up is negative  #Lower extremity numbness/weakness Patient presents with lower extremity numbness and weakness after a recent syncopal event.  Currently all MRI imaging of his spine is within normal limits and shows no identifiable cause of his numbness and weakness.  CT of his sinuses was negative for thrombosis which could of potentially cause his lower extremity weakness.  Furthermore, patient is able to isolate certain muscle movements such as his sacroiliac muscles but he is not able to flex at the hips otherwise.  When asked him to sit up I can appreciate tension in his lower extremity muscles, but he denies any ability to move.  He has normal reflexes in his lower extremities bilaterally. He endorses numbness to pain, temperature and gross touch sensation.  Otherwise he is neurovascularly intact.  I spoke with neurology and they recommend MRI of the brain to rule out MS.  If this is normal we could potentially do an EEG and consider outpatient EMG - MRI of brain w/ and w/o contrast  #History of DVT: Patient has a history of deep vein thrombosis several years ago.  He is not sure of the inciting events.  Patient states that he was discontinued on anticoagulation, but does not know why. -I will keep him on DVT prophylaxis dose of Lovenox here in the hospital  Diet: Heart Healthy VTE: Enoxaparin IVF:  None,None Code: Full  Prior to Admission Living Arrangement: Prison Anticipated Discharge Location: Prison Barriers to Discharge: further work up  Dispo: Admit patient to Observation with expected length of stay less than 2 midnights.  Signed: Dellia Cloudoe, Moranda Billiot, MD 06/15/2020, 2:31 PM  Pager: [phone]

## 2020-06-15 NOTE — ED Notes (Signed)
Orthostatic vitals unable to be completed at this time. Pt unable to stand/bear weight

## 2020-06-15 NOTE — ED Provider Notes (Signed)
Pt signed out by Dr. Pilar Plate.  He was awaiting thoracic and lumbar MRI.  These showed nothing significant.  He still c/o numbness to both negs and loss of strength to both legs.  He would or could not stand up.  I added on a CT head and C-spine.  These were both nl.  CT done nearly 24 hrs after headache/syncopal event so one would see a subtle SAH by now.  I spoke with Dr. Thomasena Edis (neurology) who saw pt.  His neurological exam does not make sense.  The only thing he could think of doing is a CT venogram to r/o venous thrombosis.  Pt is still unable to walk, so I don't think he can go back to jail without some PT.  Pt d/w IMTS for admission.     Jacalyn Lefevre, MD 06/15/20 1434

## 2020-06-15 NOTE — ED Notes (Signed)
Patient transported to MRI 

## 2020-06-15 NOTE — ED Notes (Signed)
Attempted to call report for the second time. No answer.

## 2020-06-15 NOTE — ED Notes (Signed)
Attempted to call report. Inpt RN unavailable at this time.

## 2020-06-15 NOTE — ED Notes (Signed)
Attempted to ambulate patient, but patient is unable to stand or bear weight. Pt has reflexes in bilat feet. Notified provider. Will await further orders.

## 2020-06-15 NOTE — ED Notes (Signed)
   06/15/20 1747  Orthostatic Lying   BP- Lying 132/72  Pulse- Lying 89  Orthostatic Sitting  BP- Sitting 138/85  Pulse- Sitting 90  Pt unable to stand fro orthostatic VS. Dr. Marchia Bond aware.

## 2020-06-15 NOTE — Procedures (Signed)
Patient Name: Jared Carlson  MRN: [id number]  Epilepsy Attending: Charlsie Quest  Referring Physician/Provider: Dr Dellia Cloud Date: 06/15/2020 Duration: 26.03 mins  Patient history: 24 y.o. male [facility] with episodes of blacking out of unknown duration had his typical event and woke with bilateral lower extremity flaccid paralysis, no sensory from the knees down and normal reflexes without bowel/bladder involvement. EEG to evaluate for seizure  Level of alertness: Awake, asleep  AEDs during EEG study: None  Technical aspects: This EEG study was done with scalp electrodes positioned according to the 10-20 International system of electrode placement. Electrical activity was acquired at a sampling rate of 500Hz  and reviewed with a high frequency filter of 70Hz  and a low frequency filter of 1Hz . EEG data were recorded continuously and digitally stored.   Description: The posterior dominant rhythm consists of 9-10 Hz activity of moderate voltage (25-35 uV) seen predominantly in posterior head regions, symmetric and reactive to eye opening and eye closing. Sleep was characterized by vertex waves, sleep spindles (12 to 14 Hz), maximal frontocentral region.  Hyperventilation did not show any EEG change.  Physiologic photic driving was not seen during photic stimulation.    IMPRESSION: This study is within normal limits. No seizures or epileptiform discharges were seen throughout the recording.  Jared Carlson 

## 2020-06-15 NOTE — ED Notes (Signed)
Pt came from the jail cell where he was found unresponsive  He came by ems here. He thinks he struck his head  Sl headache no cuts  The pt reports that he cannot feel both his legs from the knees down to his feet  No swelling  Bi-lateral pedal pulses

## 2020-06-15 NOTE — ED Provider Notes (Signed)
[facility]ENCY DEPT [facility] Emergency Department Provider Note MRN:  [id number]  Arrival date & time: 06/15/20     Chief Complaint   syncopal episode   History of Present Illness   Jared Carlson is a 24 y.o. year-old male with a history of DVT presenting to the ED with chief complaint of syncopal episode.  Patient has had a few fainting spells in the past and he felt one coming on while in his [place] began to feel lightheaded.  Called out for help but no one came.  He fell to the ground and was told that he was found in an awkward position.  Since wakening he has had issues with loss of sensation and strength to his legs.  Denies chest pain or shortness of breath, no abdominal pain, does not think that he hit his head, no nausea vomiting.  Mild back pain.  No neck pain.  Review of Systems  A complete 10 system review of systems was obtained and all systems are negative except as noted in the HPI and PMH.   Patient's Health History    Past Medical History:  Diagnosis Date  . DVT (deep venous thrombosis) (HCC)     History reviewed. No pertinent surgical history.  Family History  Problem Relation Age of Onset  . Healthy Mother   . Healthy Father     Social History   Socioeconomic History  . Marital status: Single    Spouse name: Not on file  . Number of children: Not on file  . Years of education: Not on file  . Highest education level: Not on file  Occupational History  . Not on file  Tobacco Use  . Smoking status: Never Smoker  . Smokeless tobacco: Never Used  Vaping Use  . Vaping Use: Never used  Substance and Sexual Activity  . Alcohol use: Never  . Drug use: Never  . Sexual activity: Not on file  Other Topics Concern  . Not on file  Social History Narrative  . Not on file   Social Determinants of Health   Financial Resource Strain: Not on file  Food Insecurity: Not on file  Transportation Needs: Not on file  Physical Activity: Not on file   Stress: Not on file  Social Connections: Not on file  Intimate Partner Violence: Not on file     Physical Exam   Vitals:   06/15/20 0108 06/15/20 0452  BP: (!) 143/95 (!) 152/87  Pulse: 92 84  Resp: 15 18  Temp: 98.1 F (36.7 C) 98.7 F (37.1 C)  SpO2: 100% 100%    CONSTITUTIONAL: Well-appearing, NAD NEURO:  Alert and oriented x 3, decreased strength and sensation to the bilateral lower extremities, question of poor effort EYES:  eyes equal and reactive ENT/NECK:  no LAD, no JVD CARDIO: Regular rate, well-perfused, normal S1 and S2 PULM:  CTAB no wheezing or rhonchi GI/GU:  normal bowel sounds, non-distended, non-tender MSK/SPINE:  No gross deformities, no edema SKIN:  no rash, atraumatic PSYCH:  Appropriate speech and behavior  *Additional and/or pertinent findings included in MDM below  Diagnostic and Interventional Summary    EKG Interpretation  Date/Time:  Saturday June 14 2020 18:07:33 EST Ventricular Rate:  66 PR Interval:  134 QRS Duration: 88 QT Interval:  372 QTC Calculation: 389 R Axis:   84 Text Interpretation: Normal sinus rhythm with sinus arrhythmia Acute pericarditis Abnormal ECG Confirmed by Kennis Carina [phone]) on 06/15/2020 6:29:16 AM      Labs  Reviewed  BASIC METABOLIC PANEL - Abnormal; Notable for the following components:      Result Value   CO2 19 (*)    Creatinine, Ser 1.28 (*)    All other components within normal limits  URINALYSIS, ROUTINE W REFLEX MICROSCOPIC - Abnormal; Notable for the following components:   Color, Urine AMBER (*)    Ketones, ur 20 (*)    Protein, ur 30 (*)    Bacteria, UA RARE (*)    All other components within normal limits  CBC  CBG MONITORING, ED    MR THORACIC SPINE WO CONTRAST    (Results Pending)  MR LUMBAR SPINE WO CONTRAST    (Results Pending)    Medications - No data to display   Procedures  /  Critical Care Procedures  ED Course and Medical Decision Making  I have reviewed the triage  vital signs, the nursing notes, and pertinent available records from the EMR.  Listed above are laboratory and imaging tests that I personally ordered, reviewed, and interpreted and then considered in my medical decision making (see below for details).  Considering spinal cord injury versus conversion disorder.  EKG is reassuring, syncopal episode thought to be benign.  No chest pain or shortness of breath, no tachycardia, no evidence of DVT, doubt VTE.  Awaiting MRI.  Signed out to oncoming provider at shift change.       Elmer Sow. Pilar Plate, MD [facility] Emergency Medicine [facility] [email or web address]  Final Clinical Impressions(s) / ED Diagnoses     ICD-10-CM   1. Weakness of both lower extremities  R29.898     ED Discharge Orders    None       Discharge Instructions Discussed with and Provided to Patient:   Discharge Instructions   None       Sabas Sous, MD 06/15/20 [phone]

## 2020-06-15 NOTE — ED Notes (Signed)
Assuming care of patient at this time. Pt is resting in bed. Officer at bedside. Orders in place to pre medicate patient when MRI is ready. Call bell within reach.

## 2020-06-15 NOTE — ED Notes (Signed)
Pt may drink.  

## 2020-06-15 NOTE — Consult Note (Signed)
Neurology Consult H&P  CC: sudden bilateral lower extremity weakness and numbness  History is obtained from: Patient and chart  HPI: Jared Carlson is a 24 y.o. male right handed  is an inmate PMHx DVT [facility] from the prison for syncopal episode. States that he has fainting episodes. He know when they are about to happen and describes prodrome as feeling lightheaded and his vision becomes blurry, then goes black and wakes up after at times with a headache. Unknown duration. No associated biting of tongue/cheek/lip. No associated bladder/bowel incontinence.  Today's event:Earlier today he began to feel lightheaded and knew he was about to faint. He started to bang against the "cage" and call out for help but no one came. The guards found him down in an awkward position on the floor half naked. When he woke he was not able to move his legs and noticed he had no sensory from the knees down. He does not think he hit his head but developed a awful headache which worsened en route and characterized as dull throbbing, 7/10, at the occiput radiating anteriorly. There is not bowel/bladder involvement.  No family history of epilepsy or degenerative neurologic condition.  Denies chest pain or shortness of breath, no abdominal pain, does not think that he hit his head, no nausea vomiting. No neck pain.  There is not bowel/bladder involvement  ROS: A complete ROS was performed and is negative except as noted in the HPI.   Past Medical History:  Diagnosis Date   DVT (deep venous thrombosis) (HCC)    Family History  Problem Relation Age of Onset   Healthy Mother    Healthy Father     Social History:  reports that he has never smoked. He has never used smokeless tobacco. He reports that he does not drink alcohol and does not use drugs.   Prior to Admission medications   Medication Sig Start Date End Date Taking? Authorizing Provider  HYDROcodone-acetaminophen (NORCO) 7.5-325 MG tablet Take  1 tablet by mouth every 6 (six) hours as needed for moderate pain. 03/14/19   Grayce Sessions, NP  methocarbamol (ROBAXIN-750) 750 MG tablet Take 1 tablet (750 mg total) by mouth 4 (four) times daily. 02/25/19   Lorre Nick, MD  naproxen (NAPROSYN) 375 MG tablet Take 1 tablet (375 mg total) by mouth 2 (two) times daily as needed for moderate pain. 06/11/20   Gwyneth Sprout, MD  oxyCODONE-acetaminophen (PERCOCET/ROXICET) 5-325 MG tablet Take 2 tablets by mouth every 4 (four) hours as needed for severe pain. 02/25/19   Lorre Nick, MD  rivaroxaban (XARELTO) 20 MG TABS tablet Take 1 tablet (20 mg total) by mouth daily with supper. For use after pt has completed starter pack Patient not taking: Reported on 03/14/2019 03/25/19 06/11/20  Dartha Lodge, PA-C    Exam: Current vital signs: BP 126/82    Pulse (!) 104    Temp 98.7 F (37.1 C) (Oral)    Resp 18    SpO2 99%   Physical Exam  Constitutional: Appears well-developed and well-nourished.  Psych: Affect appropriate to situation Eyes: No scleral injection HENT: No OP obstrucion Head: Normocephalic.  Cardiovascular: Normal rate and regular rhythm.  Respiratory: Effort normal and breath sounds normal to anterior ascultation GI: Soft.  No distension. There is no tenderness.  Skin: WDI  Neuro: Mental Status: Patient is awake, alert, oriented to person, place, month, year, and situation. Patient is able to give a clear and coherent history. No signs of aphasia or  neglect. Cranial Nerves: II: Visual Fields are full. Pupils are equal, round, and reactive to light. III,IV, VI: EOMI without ptosis or diploplia.  V: Facial sensation is symmetric to temperature VII: Facial movement is symmetric.  VIII: hearing is intact to voice X: Uvula elevates symmetrically XI: Shoulder shrug is symmetric. XII: tongue is midline without atrophy or fasciculations.  Motor: Tone is normal. Bulk is normal. 5/5 strength upper 0/5 lower  extremities. Sensory: Sensation is symmetric sharp in the arms and legs down to knees. Decreased sharp in L3-5 and S1 just below the knees to toes. Deep Tendon Reflexes: 2+ and symmetric in the biceps and patellae. Plantars: Toes are downgoing bilaterally. Cerebellar: FNF and HKS are intact bilaterally.  I have reviewed labs in epic and the pertinent results are:   Ref. Range 06/14/2020 18:54  Sodium Latest Ref Range: 135 - 145 mmol/L 137  Potassium Latest Ref Range: 3.5 - 5.1 mmol/L 3.5  Chloride Latest Ref Range: 98 - 111 mmol/L 103  CO2 Latest Ref Range: 22 - 32 mmol/L 19 (L)  Glucose Latest Ref Range: 70 - 99 mg/dL 88  BUN Latest Ref Range: 6 - 20 mg/dL 9  Creatinine Latest Ref Range: 0.61 - 1.24 mg/dL 2.75 (H)  Calcium Latest Ref Range: 8.9 - 10.3 mg/dL 9.4    I have reviewed the images obtained: MRI thoracic spine showed mild multilevel degenerative change small disc bulge at T9-T10 and mild multilevel foraminal stenosis in the lower thoracic spine. No significant canal stenosis. MRI lumbar spine mild multilevel foraminal stenosis at L2-L3, L3-L4, L4-L5. CT venogram showed patent superior sagittal sinus. Both transverse sinuses are patent with the right being dominant. Both sigmoid sinuses are patent and there is flow in both jugular veins. Deep venous system is normal.   Assessment: Jared Carlson is a 24 y.o. male [facility] with episodes of blacking out of unknown duration had his typical event and woke with bilateral lower extremity flaccid paralysis, no sensory from the knees down and normal reflexes without bowel/bladder involvement. CTV brain did not show venous sinus thrombosis. MRI lumbar spine only showed mild foraminal stenoses. Curious constellation of symptoms and exam which are significantly out of proportion to imaging findings. Will investigate paroxysmal events to rule out seizure and MRI brain with contrast to investigate possible demyelinating  disease. If these studies are negative he may need outpatient electromyogram.  Plan: - MRI brain with contrast. - Routine EEG to eval for any epileptogenic discharges and if negative may need outpatient cardiology evaluation for syncope. - Discussed with [facility] and they would be happy to perform outpatient electromyogram. - Neurology will continue to follow.  Electronically signed by: Dr. Marisue Humble Pager: [phone] 06/15/2020, 2:15 PM

## 2020-06-15 NOTE — ED Notes (Signed)
No back pain

## 2020-06-15 NOTE — ED Notes (Signed)
Received call from inpt RN. Pt to be transported to floor shortly.

## 2020-06-15 NOTE — Progress Notes (Signed)
EEG complete - results pending 

## 2020-06-16 ENCOUNTER — Observation Stay (HOSPITAL_COMMUNITY)

## 2020-06-16 DIAGNOSIS — R29898 Other symptoms and signs involving the musculoskeletal system: Secondary | ICD-10-CM | POA: Diagnosis not present

## 2020-06-16 LAB — RETICULOCYTES
Immature Retic Fract: 2.5 % (ref 2.3–15.9)
RBC.: 4.69 MIL/uL (ref 4.22–5.81)
Retic Count, Absolute: 38 10*3/uL (ref 19.0–186.0)
Retic Ct Pct: 0.8 % (ref 0.4–3.1)

## 2020-06-16 LAB — COMPREHENSIVE METABOLIC PANEL
ALT: 19 U/L (ref 0–44)
AST: 18 U/L (ref 15–41)
Albumin: 3.5 g/dL (ref 3.5–5.0)
Alkaline Phosphatase: 100 U/L (ref 38–126)
Anion gap: 11 (ref 5–15)
BUN: 10 mg/dL (ref 6–20)
CO2: 25 mmol/L (ref 22–32)
Calcium: 9.2 mg/dL (ref 8.9–10.3)
Chloride: 102 mmol/L (ref 98–111)
Creatinine, Ser: 1.23 mg/dL (ref 0.61–1.24)
GFR, Estimated: >60 mL/min (ref >60)
Glucose, Bld: 116 mg/dL — ABNORMAL HIGH (ref 70–99)
Potassium: 3.4 mmol/L — ABNORMAL LOW (ref 3.5–5.1)
Sodium: 138 mmol/L (ref 135–145)
Total Bilirubin: 1.7 mg/dL — ABNORMAL HIGH (ref 0.3–1.2)
Total Protein: 6.5 g/dL (ref 6.5–8.1)

## 2020-06-16 LAB — TECHNOLOGIST SMEAR REVIEW

## 2020-06-16 LAB — CBC
HCT: 42 % (ref 39.0–52.0)
Hemoglobin: 14.6 g/dL (ref 13.0–17.0)
MCH: 30.5 pg (ref 26.0–34.0)
MCHC: 34.8 g/dL (ref 30.0–36.0)
MCV: 87.9 fL (ref 80.0–100.0)
Platelets: 127 10*3/uL — ABNORMAL LOW (ref 150–400)
RBC: 4.78 MIL/uL (ref 4.22–5.81)
RDW: 12.8 % (ref 11.5–15.5)
WBC: 7.2 10*3/uL (ref 4.0–10.5)
nRBC: 0 % (ref 0.0–0.2)

## 2020-06-16 LAB — RAPID URINE DRUG SCREEN, HOSP PERFORMED
Amphetamines: NOT DETECTED
Barbiturates: NOT DETECTED
Benzodiazepines: NOT DETECTED
Cocaine: NOT DETECTED
Opiates: NOT DETECTED
Tetrahydrocannabinol: POSITIVE — AB

## 2020-06-16 LAB — SAVE SMEAR(SSMR), FOR PROVIDER SLIDE REVIEW

## 2020-06-16 LAB — CK: Total CK: 152 U/L (ref 49–397)

## 2020-06-16 MED ORDER — THIAMINE HCL 100 MG PO TABS
100.0000 mg | ORAL_TABLET | Freq: Every day | ORAL | Status: DC
  Administered 2020-06-16 – 2020-06-17 (×2): 100 mg via ORAL
  Filled 2020-06-16 (×2): qty 1

## 2020-06-16 MED ORDER — GADOBUTROL 1 MMOL/ML IV SOLN
10.0000 mL | Freq: Once | INTRAVENOUS | Status: AC | PRN
  Administered 2020-06-16: 10 mL via INTRAVENOUS

## 2020-06-16 NOTE — Progress Notes (Signed)
Pt transported to MRI 

## 2020-06-16 NOTE — Progress Notes (Signed)
Pt arrived to unit

## 2020-06-16 NOTE — Progress Notes (Signed)
HD#0 Subjective:  Overnight Events: no overnight events  Patient resting comfortably in bed.  States that he still cannot feel anything below his knees and has weakness from his hips down.  Otherwise he denies any new complaints today.  Objective:  Vital signs in last 24 hours: Vitals:   06/15/20 1900 06/15/20 2000 06/15/20 2324 06/16/20 1157  BP: (!) 113/48 (!) 146/87 117/61 (!) 165/93  Pulse: 95 (!) 101 93 (!) 57  Resp:  18 18 16   Temp:  97.8 F (36.6 C) 98.6 F (37 C) 98.4 F (36.9 C)  TempSrc:  Oral Oral Oral  SpO2: 94%   96%   Supplemental O2: Room Air SpO2: 96 %   Physical Exam:  Physical Exam Constitutional:      Appearance: Normal appearance.  HENT:     Head: Normocephalic and atraumatic.     Mouth/Throat:     Mouth: Mucous membranes are moist.  Cardiovascular:     Rate and Rhythm: Normal rate and regular rhythm.  Abdominal:     General: Abdomen is flat. There is no distension.     Palpations: Abdomen is soft.  Musculoskeletal:        General: No swelling, tenderness, deformity or signs of injury.     Cervical back: Normal range of motion.  Skin:    General: Skin is warm and dry.  Neurological:     Mental Status: He is alert.     Comments: Numbness from the knees down 0 out of 5 weakness from the hips down Patellar reflexes were slightly hyperreflexic Patient was contracted at the knee making it difficult to bend Normal to decreased Achilles reflex And downgoing plantar reflex bilaterally     There were no vitals filed for this visit.  No intake or output data in the 24 hours ending 06/16/20 1514 Net IO Since Admission: No IO data has been entered for this period [06/16/20 1514]  No results for input(s): GLUCAP in the last 72 hours.   Pertinent Labs: CBC Latest Ref Rng & Units 06/16/2020 06/15/2020 06/14/2020  WBC 4.0 - 10.5 K/uL 7.2 8.6 7.4  Hemoglobin 13.0 - 17.0 g/dL 06/16/2020 24.4 01.0  Hematocrit 39.0 - 52.0 % 42.0 42.4 46.6  Platelets  150 - 400 K/uL 127(L) 128(L) 198    CMP Latest Ref Rng & Units 06/16/2020 06/15/2020 06/14/2020  Glucose 70 - 99 mg/dL 06/16/2020) 83 88  BUN 6 - 20 mg/dL 10 11 9   Creatinine 0.61 - 1.24 mg/dL 536(U 4.40)  Sodium 135 - 145 mmol/L 138 135 137  Potassium 3.5 - 5.1 mmol/L 3.4(L) 3.6 3.5  Chloride 98 - 111 mmol/L 102 101 103  CO2 22 - 32 mmol/L 25 19(L) 19(L)  Calcium 8.9 - 10.3 mg/dL 9.2 3.47) 9.4  Total Protein 6.5 - 8.1 g/dL 6.5 6.6 -  Total Bilirubin 0.3 - 1.2 mg/dL 4.25(Z) 2.4(H) -  Alkaline Phos 38 - 126 U/L 100 94 -  AST 15 - 41 U/L 18 19 -  ALT 0 - 44 U/L 19 18 -    Imaging: MR BRAIN W WO CONTRAST  Result Date: 06/16/2020 CLINICAL DATA:  Multiple sclerosis? EXAM: MRI HEAD WITHOUT AND WITH CONTRAST TECHNIQUE: Multiplanar, multiecho pulse sequences of the brain and surrounding structures were obtained without and with intravenous contrast. CONTRAST:  19mL GADAVIST GADOBUTROL 1 MMOL/ML IV SOLN COMPARISON:  None. FINDINGS: Brain: 4 or 5 tiny FLAIR hyperintensities in the juxtacortical white matter of the frontal lobes at the vertex.  Given the clustering, question prior trauma. No specific demyelinating pattern. No infarct, hemorrhage, hydrocephalus, or mass. 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 Skull and upper cervical spine: Normal marrow signal Sinuses/Orbits: Negative IMPRESSION: 1. No acute or subacute insult. 2. Small cluster of tiny FLAIR hyperintensities in the bifrontal white matter. No specific demyelinating pattern. 3. 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 Electronically Signed   By: Marnee Spring M.D.   On: 06/16/2020 06:33   EEG adult  Result Date: 06/15/2020 Charlsie Quest, MD     06/15/2020  6:13 PM Patient Name: Jared Carlson MRN: [id number] Epilepsy Attending: Charlsie Quest Referring Physician/Provider: Dr Dellia Cloud Date: 06/15/2020 Duration: 26.03 mins Patient history: 24 y.o. male [facility]  [facility] with episodes of blacking out of unknown duration had his typical event and woke with bilateral lower extremity flaccid paralysis, no sensory from the knees down and normal reflexes without bowel/bladder involvement. EEG to evaluate for seizure Level of alertness: Awake, asleep AEDs during EEG study: None Technical aspects: This EEG study was done with scalp electrodes positioned according to the 10-20 International system of electrode placement. Electrical activity was acquired at a sampling rate of 500Hz  and reviewed with a high frequency filter of 70Hz  and a low frequency filter of 1Hz . EEG data were recorded continuously and digitally stored. Description: The posterior dominant rhythm consists of 9-10 Hz activity of moderate voltage (25-35 uV) seen predominantly in posterior head regions, symmetric and reactive to eye opening and eye closing. Sleep was characterized by vertex waves, sleep spindles (12 to 14 Hz), maximal frontocentral region.  Hyperventilation did not show any EEG change.  Physiologic photic driving was not seen during photic stimulation.  IMPRESSION: This study is within normal limits. No seizures or epileptiform discharges were seen throughout the recording. Priyanka    Assessment/Plan:   Active Problems:   Lower extremity weakness   Patient Summary: Jared Carlson is a 24 y.o. with a pertinent PMH of DVT, who presented with syncopal event and lower extremity weakness and numbness and admitted for further evaluation.   #Syncope: Admits to signs and symptoms concerning for vasovagal/reflex syncope.  Furthermore, all work-up has been negative for alternative etiologies.  He has not had any further episodes during his hospitalization.  #Lower extremity numbness/weakness Work-up so far has been negative.  We cannot find any anatomical reason for his lower extremity numbness and weakness.  I will put in laboratory evaluation for toxic metabolic causes of  lower extremity weakness today. -Heavy metals -mercury, lead, ceruloplasmin, copper -HCV and RPR -CK -Vitamin E  #History of DVT: -Continue DVT prophylaxis  #Thrombocytopenia Patient has thrombocytopenia of unknown etiology.  His platelet count was 120s today. -Blood smear and reticulocyte count ordered  Diet: Heart Healthy VTE: Enoxaparin IVF: None,None Code: Full  [facility] in unknown amount of days pending work up.  Annabelle Harman, D.O.  Internal Medicine Resident, PGY-2 [facility]  Pager: [phone] 3:14 PM, 06/16/2020   Please contact the on call pager after 5 pm and on weekends at [phone].

## 2020-06-16 NOTE — Progress Notes (Signed)
Neurology Progress Note  S: feel the same as yesterday. Still numb from knees down and can't move LEs at all. Having some double vision with vision over 5 ft away. States PT tried to stand him but was unable. HA is resolved.    Brief new hx.: In review of chart, it appears his DVT was about one year ago (Aug 2020). He was here a week ago for syncope and left leg pain. No neurological finding. 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 Sent home. Was on Xarelto, but didn't refill it at end of November. NP finds no PCP visit since Sept 2020. He drinks 6-8 "shots of grain ETOH per day" prior to incarceration one week ago. Beer "sometimes", no wine. Vapes. Denies any trauma-psychological, physical, or sexual prior to events. See neuro consult note 12/19 for more details.   O: Current vital signs: BP 117/61 (BP Location: Left Arm)   Pulse 93   Temp 98.6 F (37 C) (Oral)   Resp 18   SpO2 94%  Vital signs in last 24 hours: Temp:  [97.8 F (36.6 C)-98.6 F (37 C)] 98.6 F (37 C) (12/19 2324) Pulse Rate:  [69-106] 93 (12/19 2324) Resp:  [18] 18 (12/19 2324) BP: (113-146)/(48-87) 117/61 (12/19 2324) SpO2:  [94 %-100 %] 94 % (12/19 1900)  GENERAL: Awake, alert in NAD. WNWD. Overweight. Pt avoids looking at NP during history and exam.  HEENT: Normocephalic and atraumatic, dry mm LUNGS: Normal respiratory effort.  CV: RRR ABDOMEN: Soft Ext: warm  NEURO:  Mental Status: AA&Ox3  Speech/Language: speech is intact.  Naming, repetition, fluency, and comprehension intact.  Cranial Nerves:  II: PERRL. Visual fields full. States double vision 5 ft away but is able to tell NP correct amount of fingers held up.  III, IV, VI: EOMI. Eyelids elevate symmetrically.  V: Sensation is intact to light touch and symmetrical to face.  VII: Smile is symmetrical. Able to puff cheeks and raise eyebrows.  VIII: hearing intact to voice. IX, X: Palate elevates symmetrically. Phonation is normal.  LY:YTKPTWSF shrug 5/5. XII: tongue is  midline without fasciculations. Motor: 5/5 strength UEs. 0/5 LEs even with adduction abduction of hips.  Tone: is normal and bulk is normal. Able to sit up in bed and support trunk. Sensation- Intact to light touch-no response to LEs toes to knees. Sensation is intact above knees bilaterally and to upper body. Extinction intact UEs.    Coordination: FTN intact bilaterally HKS: no movement of LEs. No drift UEs.  DTRs: 2+ throughout Gait- deferred  Medications  Current Facility-Administered Medications:  .  acetaminophen (TYLENOL) tablet 650 mg, 650 mg, Oral, Q6H PRN, 650 mg at 06/15/20 2030 **OR** acetaminophen (TYLENOL) suppository 650 mg, 650 mg, Rectal, Q6H PRN, Dellia Cloud, MD .  enoxaparin (LOVENOX) injection 40 mg, 40 mg, Subcutaneous, Q24H, Dellia Cloud, MD, 40 mg at 06/15/20 1741 Labs CBC    Component Value Date/Time   WBC 7.2 06/16/2020 0234   RBC 4.78 06/16/2020 0234   HGB 14.6 06/16/2020 0234   HCT 42.0 06/16/2020 0234   PLT 127 (L) 06/16/2020 0234   MCV 87.9 06/16/2020 0234   MCH 30.5 06/16/2020 0234   MCHC 34.8 06/16/2020 0234   RDW 12.8 06/16/2020 0234   LYMPHSABS 2.2 06/15/2020 1742   MONOABS 1.1 (H) 06/15/2020 1742   EOSABS 0.1 06/15/2020 1742   BASOSABS 0.0 06/15/2020 1742    CMP     Component Value Date/Time   NA 138 06/16/2020 0234   K 3.4 (L) 06/16/2020  0234   CL 102 06/16/2020 0234   CO2 25 06/16/2020 0234   GLUCOSE 116 (H) 06/16/2020 0234   BUN 10 06/16/2020 0234   CREATININE 1.23 06/16/2020 0234   CALCIUM 9.2 06/16/2020 0234   PROT 6.5 06/16/2020 0234   ALBUMIN 3.5 06/16/2020 0234   AST 18 06/16/2020 0234   ALT 19 06/16/2020 0234   ALKPHOS 100 06/16/2020 0234   BILITOT 1.7 (H) 06/16/2020 0234   GFRNONAA >60 06/16/2020 0234   GFRAA >60 02/22/2019 1040    Lipid Panel     Component Value Date/Time   CHOL 137 06/15/2020 1742   TRIG 67 06/15/2020 1742   HDL 29 (L) 06/15/2020 1742   CHOLHDL 4.7 06/15/2020 1742   VLDL 13 06/15/2020 1742    LDLCALC 95 06/15/2020 1742     Imaging/Labs I have reviewed images in epic and the results pertinent to this consultation are:  TSH 1.595, B12 1.439, HbA1c 5%. Orthostatic VS normal lying and sitting.   CT Head/CT cervical spine showed:  1. No evidence of acute intracranial abnormality. 2. No evidence of acute fracture or traumatic malalignment in the cervical spine.  MRI Brain showed: 1. No acute or subacute insult. 2. Small cluster of tiny FLAIR hyperintensities in the bifrontal white matter. No specific demyelinating pattern. 3. 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  MR lumbar spine: Mild multilevel foraminal stenosis, as detailed above. No significant canal stenosis. MR Tspine: Mild multilevel degenerative change small disc bulge at T9-T10 and mild multilevel foraminal stenosis in the lower thoracic spine. No significant canal stenosis. CTV-patent, no thrombus.    Assessment: 24 yo incarcerated male with PMHx of DVT. Presented to ED one week ago for syncope and left hip pain without acute findings and was d/c'd. Yesterday, arrived to ED with syncopal episode with LE paralysis after episode. He also has no sensation below knees.    Impression: 1. Syncope-has prodromal symptoms per chart, but when talking with pt, he says he "blacked out" without warning once in ED triage and once in ED, but there is no documentation of this. His MRI brain is negative for demyelinating disease or acute/subacute finding. His ortho vitals are normal lying and sitting, so doubt vasovagal. Consider conversion vs psychogenic disorder given occurrence since being incarcerated.  2. LE sensation change and flaccidity of LEs. Likely conversion vs psychogenic disorder. No true spinal findings on testing to suggest pathological etiology for sx. Is not incontinent, can support trunk and has no neuro deficits to UEs.  3. ETOH abuse. Marijuana use. No findings suggestive of Wernicke's or Korsakoffs.  4.  EEG neg for seizure findings.  5. Foraminal stenosis, mild, in Tspine and Lspine, but no findings to suggest cord compression or etiology of his symptoms of sensation loss and BLE weakness.   Recommendations: -Neuro f/up outpt with EMG/NCS.  -continue PT/OT -Consider outpt psych referral. -Start Thiamine for ETOH abuse. 100mg  po qd-ordered.    Pt seen by , MSN, APN-BC/Nurse Practitioner/Neuro and later by MD. Note and plan to be edited as needed by MD.  Pager: Jimmye Norman  I saw this patient with the APP on 06/16/20 and agree with the history and examination findings as documented with the following clarifications.  There are intact reflexes in the lower extremities. No truncal ataxia or sensory level, yet numb from the knees down and unable to move the legs at all from the hips. Sensory loss in a pattern that is best described as a stocking pattern from the  knees distal. I reviewed the available laboratory data and neuroimages, and other relevant tests/notes/procedures.  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 Localization in the CNS is unlikely, given stocking and glove character. Conversely, a peripheral localization is unlikely due to the severe/abrupt cutoff of sensation at the knees, and the severe/abrupt cutoff of motor function at the hips. Our recommendations are psychiatric consultation as outpatient. Outpatient PNCV/EMS if distal sensory/motor deficits persist. Other findings and recommendations as documented by Ms. Tereso Newcomer.  Will sign off, but feel free to reach out if additional questions arise.  Thank you.  Meredeth Ide, MD

## 2020-06-17 DIAGNOSIS — R29898 Other symptoms and signs involving the musculoskeletal system: Secondary | ICD-10-CM | POA: Diagnosis not present

## 2020-06-17 DIAGNOSIS — R55 Syncope and collapse: Secondary | ICD-10-CM

## 2020-06-17 DIAGNOSIS — D75838 Other thrombocytosis: Secondary | ICD-10-CM

## 2020-06-17 LAB — BRAIN NATRIURETIC PEPTIDE: B Natriuretic Peptide: 13.6 pg/mL (ref 0.0–100.0)

## 2020-06-17 LAB — RPR: RPR Ser Ql: NONREACTIVE

## 2020-06-17 LAB — CBC
HCT: 41.5 % (ref 39.0–52.0)
Hemoglobin: 14.4 g/dL (ref 13.0–17.0)
MCH: 31 pg (ref 26.0–34.0)
MCHC: 34.7 g/dL (ref 30.0–36.0)
MCV: 89.4 fL (ref 80.0–100.0)
Platelets: 131 10*3/uL — ABNORMAL LOW (ref 150–400)
RBC: 4.64 MIL/uL (ref 4.22–5.81)
RDW: 13.1 % (ref 11.5–15.5)
WBC: 6.7 10*3/uL (ref 4.0–10.5)
nRBC: 0 % (ref 0.0–0.2)

## 2020-06-17 LAB — HEAVY METALS, BLOOD

## 2020-06-17 LAB — CERULOPLASMIN: Ceruloplasmin: 28.9 mg/dL (ref 16.0–31.0)

## 2020-06-17 LAB — HCV AB W REFLEX TO QUANT PCR: HCV Ab: <0.1 s/co ratio (ref 0.0–0.9)

## 2020-06-17 LAB — HCV INTERPRETATION

## 2020-06-17 NOTE — Progress Notes (Signed)
PT Cancellation Note  Patient Details Name: Jared Carlson MRN: [id number] DOB: 1996-05-29   Cancelled Treatment:    Reason Eval/Treat Not Completed: Other (comment).  Having a meeting and will retry at another time.   Ivar Drape 06/17/2020, 11:09 AM   Samul Dada, PT MS Acute Rehab Dept. Number: [facility] [id number] and [facility] [phone]

## 2020-06-17 NOTE — TOC Transition Note (Addendum)
Transition of Care [facility]) - CM/SW Discharge Note   Patient Details  Name: Jared Carlson MRN: [id number] Date of Birth: 1995-07-22  [facility] CM/SW Contact:  Lawerance Sabal, RN Phone Number: 06/17/2020, 3:32 PM   Clinical Narrative:   Sherron Monday w [facility] medical desk re potential DC today. Provided my number to the nurse for her to call me back.  Spoke w patient's guard at bedside. He states that he can transport DME back to jail. He states that they will transport patient back to jail in a wheelchair Zenaida Niece, this will be arranged by guard once WC delivered to room and patient has DC order.  Ordered 3/1 w drop arm, rolling walker, and WC to room through [employer].    Final next level of care: [facility] Barriers to Discharge: No Barriers Identified   Patient Goals and CMS Choice        Discharge Placement                       Discharge Plan and Services                DME Arranged: 3-N-1,Walker rolling,Wheelchair manual DME Agency: [employer] Date DME Agency Contacted: 06/17/20 Time DME Agency Contacted: 1532 Representative spoke with at DME Agency: Maud Deed HH Arranged: NA          Social Determinants of Health (SDOH) Interventions     Readmission Risk Interventions No flowsheet data found.

## 2020-06-17 NOTE — Plan of Care (Signed)
Explained to pt importance of urinating and problems that can occur from holding urine. Encourage pt to make around in bed and to try moving his legs

## 2020-06-17 NOTE — Progress Notes (Signed)
HD#0 Subjective:  Overnight Events: no overnight events   Patient denies any new complaints today.  Objective:  Vital signs in last 24 hours: Vitals:   06/16/20 2313 06/17/20 0500 06/17/20 0731 06/17/20 1218  BP: 137/76 132/78  (!) 146/74  Pulse: 78 76  60  Resp: 18 18  18   Temp: 98.6 F (37 C) 98.7 F (37.1 C)  98.4 F (36.9 C)  TempSrc: Oral Oral  Oral  SpO2: 98% 97%  100%  Weight:   113.3 kg   Height:   5\' 9"  (1.753 m)    Supplemental O2: Room Air SpO2: 100 %   Physical Exam:  Physical Exam Constitutional:      Appearance: Normal appearance.  HENT:     Head: Normocephalic and atraumatic.  Eyes:     Extraocular Movements: Extraocular movements intact.  Cardiovascular:     Rate and Rhythm: Normal rate.     Pulses: Normal pulses.     Heart sounds: Normal heart sounds.  Pulmonary:     Effort: Pulmonary effort is normal.     Breath sounds: Normal breath sounds.  Abdominal:     General: Bowel sounds are normal.     Palpations: Abdomen is soft.     Tenderness: There is no abdominal tenderness.  Musculoskeletal:        General: Normal range of motion.     Cervical back: Normal range of motion.     Right lower leg: No edema.     Left lower leg: No edema.  Skin:    General: Skin is warm and dry.  Neurological:     Mental Status: He is alert and oriented to person, place, and time. Mental status is at baseline.     Comments: Lower extremity weakness 0/5 from the hips downward.  All lower extremity reflexes appeared normal  No sensation in lower extremities   Psychiatric:        Mood and Affect: Mood normal.     Filed Weights   06/17/20 0731  Weight: 113.3 kg     Intake/Output Summary (Last 24 hours) at 06/17/2020 1227 Last data filed at 06/17/2020 0900 Gross per 24 hour  Intake --  Output 670 ml  Net -670 ml   Net IO Since Admission: -670 mL [06/17/20 1227]  No results for input(s): GLUCAP in the last 72 hours.   Pertinent Labs: CBC Latest  Ref Rng & Units 06/17/2020 06/16/2020 06/15/2020  WBC 4.0 - 10.5 K/uL 6.7 7.2 8.6  Hemoglobin 13.0 - 17.0 g/dL 06/18/2020 06/17/2020 83.4  Hematocrit 39.0 - 52.0 % 41.5 42.0 42.4  Platelets 150 - 400 K/uL 131(L) 127(L) 128(L)    CMP Latest Ref Rng & Units 06/16/2020 06/15/2020 06/14/2020  Glucose 70 - 99 mg/dL 06/17/2020) 83 88  BUN 6 - 20 mg/dL 10 11 9   Creatinine 0.61 - 1.24 mg/dL 06/16/2020 979(G )  Sodium 135 - 145 mmol/L 138 135 137  Potassium 3.5 - 5.1 mmol/L 3.4(L) 3.6 3.5  Chloride 98 - 111 mmol/L 102 101 103  CO2 22 - 32 mmol/L 25 19(L) 19(L)  Calcium 8.9 - 10.3 mg/dL 9.2 9.21) 9.4  Total Protein 6.5 - 8.1 g/dL 6.5 6.6 -  Total Bilirubin 0.3 - 1.2 mg/dL 1.94) 2.4(H) -  Alkaline Phos 38 - 126 U/L 100 94 -  AST 15 - 41 U/L 18 19 -  ALT 0 - 44 U/L 19 18 -    Imaging: No results found.  Assessment/Plan:  Active Problems:   Lower extremity weakness   Patient Summary: Jared Carlson is a 24 y.o. with a pertinent PMH of DVT, who presented with syncopal event and lower extremity weakness and numbness and admitted for further evaluation.   #Syncope: All work up has been negative. He has not had any further episodes during his hospitalization. - Follow up in the outpatient setting with PCP.  #Lower extremity numbness/weakness All work-up has been negative without a good explanation for his lower extremity weakness. He will need an outpatient evaluation by neurology for EMG. -Heavy metals -mercury, lead, pending -Vitamin E pending - Outpatient EMG  #History of DVT: -Continue DVT prophylaxis  #Thrombocytopenia Patient has thrombocytopenia of unknown etiology.  His platelet count was 120s today. -Blood smear is WNL.  Diet: Regular diet IVF: PO intake VTE: enoxaparin (LOVENOX) injection 40 mg Start: 06/15/20 1615 Code: Full PT/OT: Pending  Anticipated discharge to Home today pending PT evalution.  Chari Manning, D.O.  Internal Medicine Resident, PGY-2 [facility]  Internal Medicine Residency  Pager: [phone] 12:27 PM, 06/17/2020   Please contact the on call pager after 5 pm and on weekends at [phone].

## 2020-06-17 NOTE — Progress Notes (Signed)
  Pt was seen for PT evaluation and will recommend a drop arm bedside commode, and will need a wheelchair with 20" width, legrests that swing away.  Also recommend a cushion on the chair.   Samul Dada, PT MS Samul Dada, PT  MS Acute Rehab Dept. Number: [facility] [phone] and [facility] [phone]

## 2020-06-17 NOTE — Evaluation (Signed)
Physical Therapy Evaluation Patient Details Name: Cohl Behrens MRN: [id number] DOB: Oct 29, 1995 Today's Date: 06/17/2020   History of Present Illness  24 yo male with onset of syncopal episode was admitted, reported he had tried to get guard's attention before the light headed feelings caused him to end up on the ground.  Pt complains of weakness and  numbness on LE's mainly below knees but has symptoms on hips as well.  No findings were noted on C-spine or brain with imaging.  PMHx:  DVT, Schmorl node T1,  Clinical Impression  Pt was seen for mobility on side of bed and to get to [facility].  He is reaching across with UE's and sliding over, PT is not helping in a significant way.  Pt demonstrates sporadic active use of quadriceps BLE's, inconsistent effort to extend hips.  Abd and adduction are actively used to move LE's, and otherwise strength is [facility] on LE's.  Will encourage him to use mm's in LE's and progress with transfers and to do standing.  Pt is reporting stocking pattern on lower legs of numbness.      Follow Up Recommendations Home health PT;Supervision/Assistance - 24 hour;Supervision for mobility/OOB    Equipment Recommendations  Wheelchair (measurements PT);Wheelchair cushion (measurements PT);Other (comment) (bariatric drop arm commode)    Recommendations for Other Services       Precautions / Restrictions Precautions Precautions: Fall Precaution Comments: Monitor for safety with ck of orthostatics if light headed Restrictions Weight Bearing Restrictions: No      Mobility  Bed Mobility Overal bed mobility: Modified Independent             General bed mobility comments: pt can get legs to side of bed and gets light help to get lower legs onto bed    Transfers Overall transfer level: Needs assistance Equipment used: 1 person hand held assist Transfers: Lateral/Scoot Transfers          Lateral/Scoot Transfers: Min assist;From elevated surface General transfer  comment: pt used UE's to swing his hips to [facility] but brought legs onto bed to do AP transfer to return to bed  Ambulation/Gait             General Gait Details: did not stand up  Stairs            Wheelchair Mobility    Modified Rankin (Stroke Patients Only)       Balance Overall balance assessment: History of Falls;Needs assistance Sitting-balance support: Feet supported Sitting balance-Leahy Scale: Fair Sitting balance - Comments: good tolerance for sitting bedside and on BSC, able to scoot up and down side of bed without assist and using mainly UE's                                     Pertinent Vitals/Pain Pain Assessment: No/denies pain    Home Living Family/patient expects to be discharged to:: [facility]                 Additional Comments: equipment ordered    Prior Function Level of Independence: Independent         Comments: walked with no AD     Hand Dominance   Dominant Hand: Right    Extremity/Trunk Assessment   Upper Extremity Assessment Upper Extremity Assessment: Overall WFL for tasks assessed    Lower Extremity Assessment Lower Extremity Assessment: Generalized weakness    Cervical / Trunk Assessment Cervical /  Trunk Assessment: Normal  Communication   Communication: No difficulties  Cognition Arousal/Alertness: Awake/alert Behavior During Therapy: WFL for tasks assessed/performed Overall Cognitive Status: Within Functional Limits for tasks assessed                                 General Comments: pt is up to side of bed with independent scooting and able to get his legs to side of bed.  Using thigh mm to shift legs,      General Comments General comments (skin integrity, edema, etc.): pt demonstrates some active use of quads and hip extensors, active use of L knee hamstrings.    Exercises     Assessment/Plan    PT Assessment Patient needs continued PT services  PT Problem  List Decreased strength;Decreased range of motion;Decreased activity tolerance;Decreased balance;Decreased mobility;Decreased knowledge of use of DME;Decreased knowledge of precautions       PT Treatment Interventions DME instruction;Gait training;Stair training;Functional mobility training;Therapeutic activities;Therapeutic exercise;Balance training;Neuromuscular re-education;Patient/family education    PT Goals (Current goals can be found in the Care Plan section)  Acute Rehab PT Goals Patient Stated Goal: to be able to get stronger PT Goal Formulation: With patient Time For Goal Achievement: 07/08/20 Potential to Achieve Goals: Good    Frequency Min 2X/week   Barriers to discharge Decreased caregiver support pt will be going to lock up    Co-evaluation               AM-PAC PT "6 Clicks" Mobility  Outcome Measure Help needed turning from your back to your side while in a flat bed without using bedrails?: A Little Help needed moving from lying on your back to sitting on the side of a flat bed without using bedrails?: A Little Help needed moving to and from a bed to a chair (including a wheelchair)?: A Little Help needed standing up from a chair using your arms (e.g., wheelchair or bedside chair)?: A Little Help needed to walk in hospital room?: A Little Help needed climbing 3-5 steps with a railing? : A Lot 6 Click Score: 17    End of Session   Activity Tolerance: Patient limited by fatigue;Treatment limited secondary to medical complications (Comment) Patient left: in bed;with call bell/phone within reach;Other (comment) (Emergency planning/management officer waiting in room) Nurse Communication: Mobility status PT Visit Diagnosis: Unsteadiness on feet (R26.81)    Time: 2774-1287 PT Time Calculation (min) (ACUTE ONLY): 32 min   Charges:   PT Evaluation $PT Eval Moderate Complexity: 1 Mod PT Treatments $Therapeutic Activity: 8-22 mins       Ivar Drape 06/17/2020, 2:15 PM  Samul Dada, PT MS Acute Rehab Dept. Number: [facility] [id number] and [facility] [phone]

## 2020-06-17 NOTE — Progress Notes (Signed)
Jared Carlson to be D/C'd to jail per MD order.  Discussed with the patient and all questions fully answered.   VSS, Skin clean, dry and intact without evidence of skin break down, no evidence of skin tears noted. IV catheter discontinued intact. Site without signs and symptoms of complications. Dressing and pressure applied.   An After Visit Summary was printed and given to the patient.    D/C education completed with patient/family including follow up instructions, medication list, d/c activities limitations if indicated, with other d/c instructions as indicated by MD - patient able to verbalize understanding, all questions fully answered.    Patient instructed to return to ED, call 911, or call MD for any changes in condition.    Patient escorted via WC, and D/C to jail via police.

## 2020-06-18 LAB — PATHOLOGIST SMEAR REVIEW

## 2020-06-20 LAB — COPPER, SERUM: Copper: 143 ug/dL — ABNORMAL HIGH (ref 63–121)

## 2020-06-24 LAB — VITAMIN E
Vitamin E (Alpha Tocopherol): 4.8 mg/L — ABNORMAL LOW (ref 5.9–19.4)
Vitamin E(Gamma Tocopherol): 1 mg/L (ref 0.7–4.9)

## 2020-06-26 ENCOUNTER — Other Ambulatory Visit: Payer: Self-pay

## 2020-06-26 ENCOUNTER — Encounter (HOSPITAL_COMMUNITY): Payer: Self-pay

## 2020-06-26 ENCOUNTER — Emergency Department (HOSPITAL_COMMUNITY)
Admission: EM | Admit: 2020-06-26 | Discharge: 2020-06-26 | Disposition: A | Discharge status: Home / Self Care | Attending: Emergency Medicine | Admitting: Emergency Medicine

## 2020-06-26 DIAGNOSIS — M79662 Pain in left lower leg: Secondary | ICD-10-CM | POA: Diagnosis not present

## 2020-06-26 DIAGNOSIS — Z5321 Procedure and treatment not carried out due to patient leaving prior to being seen by health care provider: Secondary | ICD-10-CM | POA: Insufficient documentation

## 2020-06-26 NOTE — ED Notes (Signed)
Pt not present upon shift change this am.

## 2020-06-26 NOTE — ED Triage Notes (Signed)
Pt sts lower left leg pain of 2 days. Fells similar to his previous DVT's. Unable to tell if swollen.

## 2020-06-28 ENCOUNTER — Emergency Department (HOSPITAL_BASED_OUTPATIENT_CLINIC_OR_DEPARTMENT_OTHER)
Admission: EM | Admit: 2020-06-28 | Discharge: 2020-06-28 | Disposition: A | Discharge status: Home / Self Care | Attending: Emergency Medicine | Admitting: Emergency Medicine

## 2020-06-28 ENCOUNTER — Encounter (HOSPITAL_BASED_OUTPATIENT_CLINIC_OR_DEPARTMENT_OTHER): Payer: Self-pay | Admitting: *Deleted

## 2020-06-28 ENCOUNTER — Other Ambulatory Visit: Payer: Self-pay

## 2020-06-28 ENCOUNTER — Other Ambulatory Visit (HOSPITAL_BASED_OUTPATIENT_CLINIC_OR_DEPARTMENT_OTHER): Payer: Self-pay | Admitting: Emergency Medicine

## 2020-06-28 ENCOUNTER — Emergency Department (HOSPITAL_BASED_OUTPATIENT_CLINIC_OR_DEPARTMENT_OTHER)
Admit: 2020-06-28 | Discharge: 2020-06-28 | Disposition: A | Discharge status: Home / Self Care | Attending: Emergency Medicine | Admitting: Emergency Medicine

## 2020-06-28 ENCOUNTER — Telehealth (HOSPITAL_BASED_OUTPATIENT_CLINIC_OR_DEPARTMENT_OTHER): Payer: Self-pay | Admitting: Emergency Medicine

## 2020-06-28 DIAGNOSIS — Z86718 Personal history of other venous thrombosis and embolism: Secondary | ICD-10-CM | POA: Insufficient documentation

## 2020-06-28 DIAGNOSIS — Z7901 Long term (current) use of anticoagulants: Secondary | ICD-10-CM | POA: Insufficient documentation

## 2020-06-28 DIAGNOSIS — M79662 Pain in left lower leg: Secondary | ICD-10-CM | POA: Insufficient documentation

## 2020-06-28 DIAGNOSIS — R0989 Other specified symptoms and signs involving the circulatory and respiratory systems: Secondary | ICD-10-CM | POA: Insufficient documentation

## 2020-06-28 MED ORDER — RIVAROXABAN 15 MG PO TABS
15.0000 mg | ORAL_TABLET | Freq: Once | ORAL | Status: AC
  Administered 2020-06-28: 15 mg via ORAL
  Filled 2020-06-28: qty 1

## 2020-06-28 MED ORDER — RIVAROXABAN (XARELTO) VTE STARTER PACK (15 & 20 MG): ORAL_TABLET | ORAL | 0 refills | Status: DC

## 2020-06-28 MED ORDER — HYDROCODONE-ACETAMINOPHEN 5-325 MG PO TABS
2.0000 | ORAL_TABLET | Freq: Once | ORAL | Status: AC
  Administered 2020-06-28: 2 via ORAL
  Filled 2020-06-28: qty 2

## 2020-06-28 NOTE — Telephone Encounter (Signed)
Patient was seen in the ED yesterday by myself for suspected DVT. He was given a dose of Xarelto scheduled for outpatient DVT study. I just got a call from radiology that he is positive for popliteal DVT on the left. We will start him back on Xarelto.

## 2020-06-28 NOTE — ED Provider Notes (Signed)
[facility] Provider Note: Lowella Dell, MD, FACEP  CSN: [id number] MRN: [id number] ARRIVAL: 06/28/20 at 0538 ROOM: MH10/MH10   CHIEF COMPLAINT  Leg Pain   HISTORY OF PRESENT ILLNESS  06/28/20 5:47 AM Jared Carlson is a 25 y.o. male with a past history of DVT.  He is here with pain in his left lower leg for 1 week that worsened this morning.  The pain is like that of previous DVT.  He rates the pain as a 9 out of 10, worse with walking or with palpation of his calf.  He denies any injury.  He was seen in the ED on 06/11/2020 with left calf pain and had a work-up that was negative for PE and negative for DVT.  He denies chest pain or shortness of breath.   Past Medical History:  Diagnosis Date  . DVT (deep venous thrombosis) (HCC)     History reviewed. No pertinent surgical history.  Family History  Problem Relation Age of Onset  . Healthy Mother   . Healthy Father     Social History   Tobacco Use  . Smoking status: Never Smoker  . Smokeless tobacco: Never Used  Vaping Use  . Vaping Use: Never used  Substance Use Topics  . Alcohol use: Yes  . Drug use: Yes    Frequency: 7.0 times per week    Types: Marijuana    Prior to Admission medications   Medication Sig Start Date End Date Taking? Authorizing Provider  rivaroxaban (XARELTO) 20 MG TABS tablet Take 1 tablet (20 mg total) by mouth daily with supper. For use after pt has completed starter pack Patient not taking: Reported on 03/14/2019 03/25/19 06/11/20  Dartha Lodge, PA-C    Allergies Patient has no known allergies.   REVIEW OF SYSTEMS  Negative except as noted here or in the History of Present Illness.   PHYSICAL EXAMINATION  Initial Vital Signs Blood pressure 138/68, pulse 94, temperature 99.1 F (37.3 C), temperature source Oral, resp. rate 20, height 5\' 9"  (1.753 m), weight 113.4 kg, SpO2 94 %.  Examination General: Well-developed, well-nourished male in no acute distress; appearance  consistent with age of record HENT: normocephalic; atraumatic Eyes: Normal appearance Neck: supple Heart: regular rate and rhythm Lungs: clear to auscultation bilaterally Abdomen: soft; nondistended; nontender; bowel sounds present Extremities: No deformity; tenderness and mild edema of left calf with pain on movement of left ankle Neurologic: Awake, alert and oriented; motor function intact in all extremities and symmetric; no facial droop Skin: Warm and dry Psychiatric: Normal mood and affect   RESULTS  Summary of this visit's results, reviewed and interpreted by myself:   EKG Interpretation  Date/Time:    Ventricular Rate:    PR Interval:    QRS Duration:   QT Interval:    QTC Calculation:   R Axis:     Text Interpretation:        Laboratory Studies: No results found for this or any previous visit (from the past 24 hour(s)). Imaging Studies: No results found.  ED COURSE and MDM  Nursing notes, initial and subsequent vitals signs, including pulse oximetry, reviewed and interpreted by myself.  Vitals:   06/28/20 0545  BP: 138/68  Pulse: 94  Resp: 20  Temp: 99.1 F (37.3 C)  TempSrc: Oral  SpO2: 94%  Weight: 113.4 kg  Height: 5\' 9"  (1.753 m)   Medications  HYDROcodone-acetaminophen (NORCO/VICODIN) 5-325 MG per tablet 2 tablet (has no administration in time  range)  Rivaroxaban (XARELTO) tablet 15 mg (has no administration in time range)    We will dose patient with Xarelto and have him return later this morning for Doppler ultrasound.  PROCEDURES  Procedures   ED DIAGNOSES     ICD-10-CM   1. Suspected DVT (deep vein thrombosis)  R09.89        Paula Libra, MD 06/28/20 [phone]

## 2020-06-28 NOTE — ED Triage Notes (Signed)
Left lower leg pain x 1 week. Denies injury. Reports hx of DVT. Denies SOB. Noncompliant with blood thinners.

## 2020-07-21 NOTE — Discharge Summary (Signed)
Name: Jared Carlson MRN: [id number] DOB: 15-Nov-1995 24 y.o. PCP: Patient, No Pcp Per  Date of Admission: 06/14/2020  6:12 PM Date of Discharge: 06/17/2020  Attending Physician: Dr. Mayford Knife  Discharge Diagnosis: Active Problems:   Lower extremity weakness  Syncope  Discharge Medications: Allergies as of 06/17/2020   No Known Allergies     Medication List    STOP taking these medications   aspirin EC 81 MG tablet   HYDROcodone-acetaminophen 7.5-325 MG tablet Commonly known as: NORCO   methocarbamol 750 MG tablet Commonly known as: Robaxin-750   naproxen 375 MG tablet Commonly known as: NAPROSYN   oxyCODONE-acetaminophen 5-325 MG tablet Commonly known as: PERCOCET/ROXICET       Disposition and follow-up:   Jared Carlson was discharged from [facility] in Stable condition.  At the hospital follow up visit please address:  1.  Follow-up: A.Lower Extremity Numbness/Weakness: - He will need follow up with Neurology for Outpatient EMG   B.Syncope: - He will need further evaluation of syncope if he continues to have these episodes.   C.Thrombocytopenia: - Will need further work up once he establishes with a PCP   2.  Labs / imaging needed at time of follow-up: Basic Metabolic Profile, CPC  3.  Pending labs/ test needing follow-up: none  Follow-up Appointments:  Follow-up Information    [facility]. Schedule an appointment as soon as possible for a visit in 3 week(s).   Specialty: Neurology Why: Call to schedule lower extremity nerve conduction testing and EMG study if your leg weakness and numbness continue. Contact information: [address] [id number] mc [place] [address] [phone]       Grayce Sessions, NP Follow up in 1 week(s).   Specialty: Internal Medicine Why: Please call to amke an appointment with your pcp in 1 week.  Contact information: [address] [place]  [place] [address] [phone]               Hospital Course by problem list:  Jared Carlson is a 25 year old male with a pertinent past medical history of DVTs not on anticoagulation who [facility] for syncopal event and lower extremity weakness. Patient underwent an extensive work up for his focal neurology deficits that were all WNL. Patient did have some mild improvement of his lower extremity strength prior to discharge and he was set up with out patient PT and DME supplies including a wheel chair. Patient will have a follow up appointment with Neurology for an EMG in the OP setting.   Discharge Vitals:   BP 134/77 (BP Location: Left Arm)   Pulse 73   Temp 99.4 F (37.4 C) (Oral)   Resp 18   Ht 5\' 9"  (1.753 m)   Wt 113.3 kg   SpO2 100%   BMI 36.89 kg/m   Pertinent Labs, Studies, and Procedures:  CBC Latest Ref Rng & Units 06/17/2020 06/16/2020 06/15/2020  WBC 4.0 - 10.5 K/uL 6.7 7.2 8.6  Hemoglobin 13.0 - 17.0 g/dL 06/17/2020 27.2 53.6  Hematocrit 39.0 - 52.0 % 41.5 42.0 42.4  Platelets 150 - 400 K/uL 131(L) 127(L) 128(L)    CMP Latest Ref Rng & Units 06/16/2020 06/15/2020 06/14/2020  Glucose 70 - 99 mg/dL 06/16/2020) 83 88  BUN 6 - 20 mg/dL 10 11 9   Creatinine 0.61 - 1.24 mg/dL 034(V 4.25)  Sodium 135 - 145 mmol/L 138 135 137  Potassium 3.5 - 5.1 mmol/L 3.4(L) 3.6 3.5  Chloride 98 -  111 mmol/L 102 101 103  CO2 22 - 32 mmol/L 25 19(L) 19(L)  Calcium 8.9 - 10.3 mg/dL 9.2 3.1(S) 9.4  Total Protein 6.5 - 8.1 g/dL 6.5 6.6 -  Total Bilirubin 0.3 - 1.2 mg/dL 9.7(W) 2.4(H) -  Alkaline Phos 38 - 126 U/L 100 94 -  AST 15 - 41 U/L 18 19 -  ALT 0 - 44 U/L 19 18 -    US Venous Img Lower Unilateral Left  Result Date: 06/28/2020 CLINICAL DATA:  Calf pain and swelling EXAM: Left LOWER EXTREMITY VENOUS DOPPLER ULTRASOUND TECHNIQUE: Gray-scale sonography with compression, as well as color and duplex ultrasound, were performed to evaluate the deep venous system(s) from the  level of the common femoral vein through the popliteal and proximal calf veins. COMPARISON:  None. FINDINGS: VENOUS Normal compressibility of the common femoral, and superficial femoral veins. Non compressible popliteal vein consistent with thrombus. Posterior tibial and peroneal veins are not well seen. There is occlusive thrombus within the tibial peroneal trunk and gastrocnemius vein. Visualized portions of profunda femoral vein and great saphenous vein unremarkable. Doppler waveforms show normal direction of venous flow, normal respiratory plasticity and response to augmentation in the patent vessels. Limited views of the contralateral common femoral vein are unremarkable. OTHER None. Limitations: none IMPRESSION: Positive for acute DVT involving the tibial peroneal trunk and popliteal vein. Critical Value/emergent results were called by telephone at the time of interpretation on 06/28/2020 at 10:29 pm to provider [facility] , who verbally acknowledged these results. Electronically Signed   By: Jasmine Pang M.D.   On: 06/28/2020 22:30     Discharge Instructions: Discharge Instructions    Diet - low sodium heart healthy   Complete by: As directed    Increase activity slowly   Complete by: As directed    FOLLOW UP: 1. [facility] - please go to follow up appointment with neurology.  2. Primary Care Physician - please make a follow up appointment with your PCP      Signed:  Chari Manning, D.O.  Internal Medicine Resident, PGY-2 [facility]  Pager: [phone]

## 2020-09-11 ENCOUNTER — Encounter (HOSPITAL_BASED_OUTPATIENT_CLINIC_OR_DEPARTMENT_OTHER): Payer: Self-pay | Admitting: *Deleted

## 2020-09-11 ENCOUNTER — Emergency Department (HOSPITAL_BASED_OUTPATIENT_CLINIC_OR_DEPARTMENT_OTHER): Payer: Self-pay

## 2020-09-11 ENCOUNTER — Inpatient Hospital Stay (HOSPITAL_BASED_OUTPATIENT_CLINIC_OR_DEPARTMENT_OTHER)
Admission: EM | Admit: 2020-09-11 | Discharge: 2020-09-15 | DRG: 176 | Disposition: A | Discharge status: Home / Self Care | Payer: Self-pay | Attending: Family Medicine | Admitting: Family Medicine

## 2020-09-11 ENCOUNTER — Other Ambulatory Visit: Payer: Self-pay

## 2020-09-11 DIAGNOSIS — Z20822 Contact with and (suspected) exposure to covid-19: Secondary | ICD-10-CM | POA: Diagnosis present

## 2020-09-11 DIAGNOSIS — I071 Rheumatic tricuspid insufficiency: Secondary | ICD-10-CM | POA: Diagnosis present

## 2020-09-11 DIAGNOSIS — Z833 Family history of diabetes mellitus: Secondary | ICD-10-CM

## 2020-09-11 DIAGNOSIS — I2699 Other pulmonary embolism without acute cor pulmonale: Principal | ICD-10-CM | POA: Diagnosis present

## 2020-09-11 DIAGNOSIS — T45516A Underdosing of anticoagulants, initial encounter: Secondary | ICD-10-CM | POA: Diagnosis present

## 2020-09-11 DIAGNOSIS — R0902 Hypoxemia: Secondary | ICD-10-CM | POA: Diagnosis present

## 2020-09-11 DIAGNOSIS — Z86718 Personal history of other venous thrombosis and embolism: Secondary | ICD-10-CM

## 2020-09-11 DIAGNOSIS — Z91120 Patient's intentional underdosing of medication regimen due to financial hardship: Secondary | ICD-10-CM

## 2020-09-11 LAB — BASIC METABOLIC PANEL
Anion gap: 12 (ref 5–15)
BUN: 7 mg/dL (ref 6–20)
CO2: 19 mmol/L — ABNORMAL LOW (ref 22–32)
Calcium: 9.1 mg/dL (ref 8.9–10.3)
Chloride: 105 mmol/L (ref 98–111)
Creatinine, Ser: 1.05 mg/dL (ref 0.61–1.24)
GFR, Estimated: >60 mL/min (ref >60)
Glucose, Bld: 140 mg/dL — ABNORMAL HIGH (ref 70–99)
Potassium: 3.9 mmol/L (ref 3.5–5.1)
Sodium: 136 mmol/L (ref 135–145)

## 2020-09-11 LAB — CBC
HCT: 48.4 % (ref 39.0–52.0)
Hemoglobin: 16.7 g/dL (ref 13.0–17.0)
MCH: 30.5 pg (ref 26.0–34.0)
MCHC: 34.5 g/dL (ref 30.0–36.0)
MCV: 88.5 fL (ref 80.0–100.0)
Platelets: 192 10*3/uL (ref 150–400)
RBC: 5.47 MIL/uL (ref 4.22–5.81)
RDW: 13.3 % (ref 11.5–15.5)
WBC: 13.2 10*3/uL — ABNORMAL HIGH (ref 4.0–10.5)
nRBC: 0 % (ref 0.0–0.2)

## 2020-09-11 NOTE — ED Triage Notes (Signed)
Pt. Reports he started with chest pain yesterday on the R side.  Pt. Said he tried to sleep it off.  Pt. Has had no nausea vomiting or diarrhea.  Pt. Reports the shortness of breath started on today and he doesn't remember what time.  Pt. Taking shallow short breaths in triage.

## 2020-09-11 NOTE — ED Triage Notes (Signed)
Pt. Never got the xarelto filled due to cost and has not been on the med.

## 2020-09-12 ENCOUNTER — Emergency Department (HOSPITAL_BASED_OUTPATIENT_CLINIC_OR_DEPARTMENT_OTHER): Payer: Self-pay

## 2020-09-12 ENCOUNTER — Encounter (HOSPITAL_COMMUNITY): Payer: Self-pay | Admitting: Family Medicine

## 2020-09-12 DIAGNOSIS — I2699 Other pulmonary embolism without acute cor pulmonale: Secondary | ICD-10-CM | POA: Diagnosis present

## 2020-09-12 LAB — CBC WITH DIFFERENTIAL/PLATELET
Abs Immature Granulocytes: 0.05 10*3/uL (ref 0.00–0.07)
Basophils Absolute: 0 10*3/uL (ref 0.0–0.1)
Basophils Relative: 0 %
Eosinophils Absolute: 0 10*3/uL (ref 0.0–0.5)
Eosinophils Relative: 0 %
HCT: 47.4 % (ref 39.0–52.0)
Hemoglobin: 16.1 g/dL (ref 13.0–17.0)
Immature Granulocytes: 0 %
Lymphocytes Relative: 15 %
Lymphs Abs: 2.1 10*3/uL (ref 0.7–4.0)
MCH: 30.4 pg (ref 26.0–34.0)
MCHC: 34 g/dL (ref 30.0–36.0)
MCV: 89.4 fL (ref 80.0–100.0)
Monocytes Absolute: 1.5 10*3/uL — ABNORMAL HIGH (ref 0.1–1.0)
Monocytes Relative: 11 %
Neutro Abs: 10.6 10*3/uL — ABNORMAL HIGH (ref 1.7–7.7)
Neutrophils Relative %: 74 %
Platelets: 191 10*3/uL (ref 150–400)
RBC: 5.3 MIL/uL (ref 4.22–5.81)
RDW: 13.4 % (ref 11.5–15.5)
WBC: 14.2 10*3/uL — ABNORMAL HIGH (ref 4.0–10.5)
nRBC: 0 % (ref 0.0–0.2)

## 2020-09-12 LAB — COMPREHENSIVE METABOLIC PANEL
ALT: 12 U/L (ref 0–44)
AST: 16 U/L (ref 15–41)
Albumin: 3.7 g/dL (ref 3.5–5.0)
Alkaline Phosphatase: 91 U/L (ref 38–126)
Anion gap: 9 (ref 5–15)
BUN: 5 mg/dL — ABNORMAL LOW (ref 6–20)
CO2: 23 mmol/L (ref 22–32)
Calcium: 9.2 mg/dL (ref 8.9–10.3)
Chloride: 106 mmol/L (ref 98–111)
Creatinine, Ser: 1.15 mg/dL (ref 0.61–1.24)
GFR, Estimated: >60 mL/min (ref >60)
Glucose, Bld: 128 mg/dL — ABNORMAL HIGH (ref 70–99)
Potassium: 3.7 mmol/L (ref 3.5–5.1)
Sodium: 138 mmol/L (ref 135–145)
Total Bilirubin: 1.7 mg/dL — ABNORMAL HIGH (ref 0.3–1.2)
Total Protein: 7.7 g/dL (ref 6.5–8.1)

## 2020-09-12 LAB — TROPONIN I (HIGH SENSITIVITY)
Troponin I (High Sensitivity): 3 ng/L (ref <18)
Troponin I (High Sensitivity): 3 ng/L (ref <18)

## 2020-09-12 LAB — RESP PANEL BY RT-PCR (FLU A&B, COVID) ARPGX2
Influenza A by PCR: NEGATIVE
Influenza B by PCR: NEGATIVE
SARS Coronavirus 2 by RT PCR: NEGATIVE

## 2020-09-12 LAB — BRAIN NATRIURETIC PEPTIDE: B Natriuretic Peptide: 45.5 pg/mL (ref 0.0–100.0)

## 2020-09-12 LAB — HEPARIN LEVEL (UNFRACTIONATED)
Heparin Unfractionated: 1.1 IU/mL — ABNORMAL HIGH (ref 0.30–0.70)
Heparin Unfractionated: 1.1 IU/mL — ABNORMAL HIGH (ref 0.30–0.70)
Heparin Unfractionated: 0.54 IU/mL (ref 0.30–0.70)

## 2020-09-12 MED ORDER — DOCUSATE SODIUM 100 MG PO CAPS
100.0000 mg | ORAL_CAPSULE | Freq: Two times a day (BID) | ORAL | Status: DC
  Administered 2020-09-12 – 2020-09-13 (×4): 100 mg via ORAL
  Filled 2020-09-12 (×5): qty 1

## 2020-09-12 MED ORDER — HEPARIN BOLUS VIA INFUSION
6000.0000 [IU] | Freq: Once | INTRAVENOUS | Status: AC
  Administered 2020-09-12: 6000 [IU] via INTRAVENOUS

## 2020-09-12 MED ORDER — HEPARIN (PORCINE) 25000 UT/250ML-% IV SOLN
1400.0000 [IU]/h | INTRAVENOUS | Status: DC
  Administered 2020-09-12 – 2020-09-13 (×2): 1400 [IU]/h via INTRAVENOUS
  Filled 2020-09-12 (×2): qty 250

## 2020-09-12 MED ORDER — ACETAMINOPHEN 650 MG RE SUPP: 650.0000 mg | Freq: Four times a day (QID) | RECTAL | Status: DC | PRN

## 2020-09-12 MED ORDER — MORPHINE SULFATE (PF) 4 MG/ML IV SOLN
4.0000 mg | Freq: Once | INTRAVENOUS | Status: AC
Start: 2020-09-12 — End: 2020-09-12
  Administered 2020-09-12: 4 mg via INTRAVENOUS
  Filled 2020-09-12: qty 1

## 2020-09-12 MED ORDER — ONDANSETRON HCL 4 MG PO TABS: 4.0000 mg | ORAL_TABLET | Freq: Four times a day (QID) | ORAL | Status: DC | PRN

## 2020-09-12 MED ORDER — HEPARIN (PORCINE) 25000 UT/250ML-% IV SOLN
1700.0000 [IU]/h | INTRAVENOUS | Status: DC
  Administered 2020-09-12: 1700 [IU]/h via INTRAVENOUS
  Filled 2020-09-12: qty 250

## 2020-09-12 MED ORDER — IOHEXOL 350 MG/ML SOLN
100.0000 mL | Freq: Once | INTRAVENOUS | Status: AC | PRN
  Administered 2020-09-12: 100 mL via INTRAVENOUS

## 2020-09-12 MED ORDER — MORPHINE SULFATE (PF) 2 MG/ML IV SOLN
2.0000 mg | INTRAVENOUS | Status: DC | PRN
  Administered 2020-09-12 – 2020-09-13 (×7): 2 mg via INTRAVENOUS
  Filled 2020-09-12 (×7): qty 1

## 2020-09-12 MED ORDER — ACETAMINOPHEN 325 MG PO TABS: 650.0000 mg | ORAL_TABLET | Freq: Four times a day (QID) | ORAL | Status: DC | PRN

## 2020-09-12 MED ORDER — ONDANSETRON HCL 4 MG/2ML IJ SOLN: 4.0000 mg | Freq: Four times a day (QID) | INTRAMUSCULAR | Status: DC | PRN

## 2020-09-12 MED ORDER — MORPHINE SULFATE (PF) 4 MG/ML IV SOLN
4.0000 mg | Freq: Once | INTRAVENOUS | Status: AC
  Administered 2020-09-12: 4 mg via INTRAVENOUS
  Filled 2020-09-12: qty 1

## 2020-09-12 MED ORDER — HYDROCODONE-ACETAMINOPHEN 5-325 MG PO TABS
1.0000 | ORAL_TABLET | ORAL | Status: DC | PRN
  Administered 2020-09-12 – 2020-09-14 (×2): 2 via ORAL
  Filled 2020-09-12 (×2): qty 2

## 2020-09-12 NOTE — Progress Notes (Signed)
ANTICOAGULATION CONSULT NOTE - Follow Up Consult  Pharmacy Consult for Heparin Indication: pulmonary embolus  No Known Allergies  Patient Measurements: Height: 5\' 9"  (175.3 cm) Weight: 113.4 kg (250 lb) IBW/kg (Calculated) : 70.7 Heparin Dosing Weight: 97 kg  Vital Signs: Temp: 99.2 F (37.3 C) (03/18 1025) Temp Source: Oral (03/18 1025) BP: 144/89 (03/18 1025) Pulse Rate: 90 (03/18 1025)  Labs: Recent Labs    09/11/20 2335 09/12/20 0131 09/12/20 1115  HGB 16.7  --  16.1  HCT 48.4  --  47.4  PLT 192  --  191  CREATININE 1.05  --   --   TROPONINIHS 3 3  --     Estimated Creatinine Clearance: 135.9 mL/min (by C-G formula based on SCr of 1.05 mg/dL).   Medications:  Infusions:  . heparin 1,700 Units/hr (09/12/20 0932)    Assessment: 25 y.o. M presents with PE. CT shows extensive bilateral PE. No definite R heart strain. CBC stable. To begin heparin per pharmacy. Pt filled and picked up a Xarelto starter pack in Aug, 2020 (using the $0 copay discount card) but never filled the maintenance dose tablets.  He was also prescribed a Xarelto starter pack in January for DVT but due to cost has not picked up this medication.  Heparin level - Ordered for 9:00 and initially drawn at 0912 at [facility], lab send to [facility], but still not resulted at 12:00.  Later resulted supratherapeutic at 1.1 - Lab redrawn on arrival to [facility] at 11:15 is supratherapeutic at 1.1 CBC stable, WNL No bleeding or complications reported.    Goal of Therapy:  Heparin level 0.3-0.7 units/ml Monitor platelets by anticoagulation protocol: Yes   Plan:  HOLD heparin for 1 hour Reduce to heparin IV infusion at 1400 units/hr at 1400 Heparin level 6 hours after starting Daily heparin level and CBC Follow up long-term anticoagulation plan due to financial barriers.   [facility] PharmD, BCPS Clinical Pharmacist [facility] main pharmacy [phone] 09/12/2020 11:41 AM

## 2020-09-12 NOTE — Progress Notes (Signed)
Pharmacy - IV heparin  Assessment:    Please see note from Lynann Beaver, PharmD earlier today for full details.  Briefly, 24 y.o. male on IV heparin for new PE. Rate previously reduced for supratherapeutic heparin level.   Most recent heparin level therapeutic at 0.54 units/ml on 1400 units/hr  No bleeding or infusion issues per RN  Plan:   Continue heparin at 1400 units/hr  Recheck confirmatory heparin level with AM labs  Bernadene Person, PharmD, BCPS [phone] 09/12/2020, 3:59 PM

## 2020-09-12 NOTE — Progress Notes (Signed)
Patient c/o not being comfortable with the care being given. He was not specific. He expressed having a bad experience at [facility] with a [employer] officier 2 months ago and his lots of his anxiety is coming from that.  Made assigned nurse aware he wanted to leave AMA especially since his girlfriend could not stay. Made the house supervisor/ on call MD aware of his decision. House supervisor came to speak with him. We provided visitation policy.Removed IV's from patient's arm and gather AMA paper  for him to sign. Patient  Started gathering his belongings and was unable to successfully do this. He stated I cant breath, this hurts, I am sick, I can't leave in this condition" and then he decided to stay.  Girlfriend left the bedside and building.

## 2020-09-12 NOTE — H&P (Signed)
History and Physical    Jared Carlson:[id number] DOB: [birth year] DOA: 09/11/2020  PCP: Patient, No Pcp Per  Patient coming from: Home  Chief Complaint: right chest pain.  HPI: Jared Carlson is a 25 y.o. male with medical history significant of DVT. Presenting with right chest pain. He states that he was donating plasma a couple of days ago when he started having a sharp, non-radiating pain in his right chest. He tried to ignore it for the first day. However yesterday, he could not get comfortable. He was having increased difficulty breathing. He was having difficulty sleeping. OTC meds were not helping his situation. His symptoms progressed throughout the night. He decided that he needed to come to the ED. He denies any other alleviating or aggravating factors.   Of note: He has had two previous DVTs. Most recent DVT was diagnosed in January of this year. He was started on xarelto, but did not continue the medication d/t cost.       ED Course: He was found to have extensive PE on CTA PE. He was started on heparin gtt. [facility] was called for admission.   Review of Systems:  Denies palpitations, N/V/D/F, syncopal episodes. Reports right chest pain, dyspnea. Review of systems is otherwise negative for all not mentioned in HPI.   PMHx Past Medical History:  Diagnosis Date  . DVT (deep venous thrombosis) (HCC)     PSHx History reviewed. No pertinent surgical history.  SocHx  reports that he has never smoked. He has never used smokeless tobacco. He reports current alcohol use. He reports current drug use. Frequency: 7.00 times per week. Drug: Marijuana.  No Known Allergies  FamHx Family History  Problem Relation Age of Onset  . Healthy Mother   . Healthy Father     Prior to Admission medications   Medication Sig Start Date End Date Taking? Authorizing Provider  RIVAROXABAN Carlena Hurl) VTE STARTER PACK (15 & 20 MG) Follow package directions: Take one 15mg  tablet by mouth twice a day.  On day 22, switch to one 20mg  tablet once a day. Take with food. 06/28/20   Mol[place]us, , MD    Physical Exam: Vitals:   09/12/20 0700 09/12/20 0730 09/12/20 0816 09/12/20 0905  BP:   (!) 146/86 124/83  Pulse: 99 88 (!) 104 91  Resp: (!) 33 20 (!) 24 (!) 24  Temp:   98.8 F (37.1 C) 98.2 F (36.8 C)  TempSrc:   Oral Oral  SpO2: 96% 94% 96% 99%  Weight:      Height:        General: 25 y.o. male resting in bed in NAD Eyes: PERRL, normal sclera ENMT: Nares patent w/o discharge, orophaynx clear, dentition normal, ears w/o discharge/lesions/ulcers Neck: Supple, trachea midline Cardiovascular: RRR, +S1, S2, no m/g/r, equal pulses throughout Respiratory: CTABL, no w/r/r, slightly increased WOB GI: BS+, NDNT, no masses noted, no organomegaly noted MSK: No e/c/c Skin: No rashes, bruises, ulcerations noted Neuro: A&O x 3, no focal deficits Psyc: Appropriate interaction and affect, calm/cooperative  Labs on Admission: I have personally reviewed following labs and imaging studies  CBC: Recent Labs  Lab 09/11/20 2335  WBC 13.2*  HGB 16.7  HCT 48.4  MCV 88.5  PLT 192   Basic Metabolic Panel: Recent Labs  Lab 09/11/20 2335  NA 136  K 3.9  CL 105  CO2 19*  GLUCOSE 140*  BUN 7  CREATININE 1.05  CALCIUM 9.1   GFR: Estimated Creatinine Clearance: 135.9 mL/min (by  C-G formula based on SCr of 1.05 mg/dL). Liver Function Tests: No results for input(s): AST, ALT, ALKPHOS, BILITOT, PROT, ALBUMIN in the last 168 hours. No results for input(s): LIPASE, AMYLASE in the last 168 hours. No results for input(s): AMMONIA in the last 168 hours. Coagulation Profile: No results for input(s): INR, PROTIME in the last 168 hours. Cardiac Enzymes: No results for input(s): CKTOTAL, CKMB, CKMBINDEX, TROPONINI in the last 168 hours. BNP (last 3 results) No results for input(s): PROBNP in the last 8760 hours. HbA1C: No results for input(s): HGBA1C in the last 72 hours. CBG: No results for  input(s): GLUCAP in the last 168 hours. Lipid Profile: No results for input(s): CHOL, HDL, LDLCALC, TRIG, CHOLHDL, LDLDIRECT in the last 72 hours. Thyroid Function Tests: No results for input(s): TSH, T4TOTAL, FREET4, T3FREE, THYROIDAB in the last 72 hours. Anemia Panel: No results for input(s): VITAMINB12, FOLATE, FERRITIN, TIBC, IRON, RETICCTPCT in the last 72 hours. Urine analysis:    Component Value Date/Time   COLORURINE AMBER (A) 06/15/2020 0318   APPEARANCEUR CLEAR 06/15/2020 0318   LABSPEC 1.026 06/15/2020 0318   PHURINE 5.0 06/15/2020 0318   GLUCOSEU NEGATIVE 06/15/2020 0318   HGBUR NEGATIVE 06/15/2020 0318   BILIRUBINUR NEGATIVE 06/15/2020 0318   KETONESUR 20 (A) 06/15/2020 0318   PROTEINUR 30 (A) 06/15/2020 0318   NITRITE NEGATIVE 06/15/2020 0318   LEUKOCYTESUR NEGATIVE 06/15/2020 0318    Radiological Exams on Admission: DG Chest 2 View  Result Date: 09/11/2020 CLINICAL DATA:  Chest pain and shortness of breath. EXAM: CHEST - 2 VIEW COMPARISON:  Chest CTA  06/11/2020 FINDINGS: Patchy peripheral opacity at the right lung base, likely localizing to the right middle lobe. Left lung is clear. Overall low lung volumes. Normal heart size and mediastinal contours. No pneumothorax. No large pleural effusion. No acute osseous abnormalities are seen. IMPRESSION: 1. Patchy right lung base opacity, may represent pneumonia in the appropriate clinical setting. Recommend correlation for infectious symptoms. 2. Pulmonary infarct could have a similar radiographic appearance. Consider chest CTA based on clinical concern. Electronically Signed   By: Narda Rutherford M.D.   On: 09/11/2020 23:39   CT Angio Chest PE W and/or Wo Contrast  Result Date: 09/12/2020 CLINICAL DATA:  Right-sided chest pain. Shortness of breath. History of DVT. Pulmonary embolus suspected. EXAM: CT ANGIOGRAPHY CHEST WITH CONTRAST TECHNIQUE: Multidetector CT imaging of the chest was performed using the standard protocol  during bolus administration of intravenous contrast. Multiplanar CT image reconstructions and MIPs were obtained to evaluate the vascular anatomy. CONTRAST:  OMNIPAQUE IOHEXOL 350 MG/ML SOLN COMPARISON:  Chest x-ray 09/11/2020, CT angio 06/11/2020 FINDINGS: Cardiovascular: Poor opacification of the pulmonary arteries to the segmental level. Central right pulmonary emboli extending to all lobes at the segmental and subsegmental levels. Segmental left lower and upper lobe pulmonary emboli extending to the subsegmental level. The main pulmonary artery is normal in caliber. Normal heart size. No increased right to left ventricular ratio. No straightening or paradoxical bowing of the interventricular septum. No significant pericardial effusion. The thoracic aorta is normal in caliber. No atherosclerotic plaque of the thoracic aorta. No coronary artery calcifications. Mediastinum/Nodes: No enlarged mediastinal, hilar, or axillary lymph nodes. Thyroid gland, trachea, and esophagus demonstrate no significant findings. Lungs/Pleura: There is a peripheral wedge-shaped ground-glass bubbly appearing opacity within the right lower lobe that likely represents a pulmonary infarction. Stable pulmonary micronodule within left lower lobe (5:41). No pleural effusion. No pneumothorax. Upper Abdomen: No acute abnormality. Musculoskeletal: No chest wall  abnormality. No acute or significant osseous findings. Review of the MIP images confirms the above findings. IMPRESSION: 1. Extensive pulmonary emboli bilaterally with associated right lower lobe pulmonary infarction. No definite right heart strain. 2. Central right pulmonary emboli extending to all lobes at the segmental and subsegmental levels. Segmental and subsegmental left lower and upper lobe pulmonary emboli. These results were called by telephone at the time of interpretation on 09/12/2020 at 2:12 am to provider Geoffery Lyons , who verbally acknowledged these results.  Electronically Signed   By: Tish Frederickson M.D.   On: 09/12/2020 02:18    EKG: Independently reviewed. NSR  Assessment/Plan Acute PE w/ RLL pulm infarct Hx of DVT Non-compliance w/ anticoagulation Right chest pain     - admit to inpt, tele     - Patient had first LLE DVT in 01/2019, was placed on xarelto; presented to ED 05/2020 with similar symptoms and there was no LLE DVT found     - Patient had second LLE DVT noted 06/2020, was placed on xarelto; however, he could not afford it, so he did not continue this medication     - Acute extensive PE noted on yesterday's CTA PE. I would not consider this a failure of xarelto, as the patient was not taking it d/t cost. Ultimately he would need to be on anticoagulation for life with his second -- now third -- clotting event. Xarelto remains a viable option for now     - check echo, BNP     - trp are negative x 2; EKG is ok     - Long conversation about the patient assistance program. He has paperwork in room. Implored him to fill out the information and call the company. I have spoken with TOC. They will work with him on this paperwork and get him plugged in with [employer]     - For now, continue heparin gtt until we can see his echo results. If no evidence of right heart strain, can resume xarelto.      - O2 support as needed     - He needs a hypercoagulability w/u which can be arranged outpt   DVT prophylaxis: heparin gtt  Code Status: FULL  Family Communication: w/ S/O at bedside Consults called: None   Status is: Inpatient  Remains inpatient appropriate because:Ongoing diagnostic testing needed not appropriate for outpatient work up   Dispo: The patient is from: Home              Anticipated d/c is to: Home              Patient currently is not medically stable to d/c.   Difficult to place patient No   Teddy Spike DO [facility]  If 7PM-7AM, please contact night-coverage [email or web address]  09/12/2020, 10:15  AM

## 2020-09-12 NOTE — ED Notes (Signed)
Report provided to [facility] for transport. 

## 2020-09-12 NOTE — Progress Notes (Addendum)
   Vital Signs @MEWSNOTE @       09/12/2020,11:32 AM   09/12/20 1025  Assess: MEWS Score  Temp 99.2 F (37.3 C)  BP (!) 144/89  Pulse Rate 90  Resp (!) 38  SpO2 99 %  Assess: MEWS Score  MEWS Temp 0  MEWS Systolic 0  MEWS Pulse 0  MEWS RR 3  MEWS LOC 0  MEWS Score 3  MEWS Score Color Yellow  Pt moved from bed to chair and became tachypnic. Pt moved back to bed. I am at bedside monitoring him and I have also text paged Dr. 09/14/20 to make him aware. Dr. Ronaldo Miyamoto advised to ensure comfort with morphine and oxygen via nasal canula since this is a suspected outcome of bilateral PEs. 2 tabs Norco given without relief of symptoms and then morphine 2mg  IV given. Pt endorse improved ability to take deep breath and endorses pain has improved. Pt appears in no apparent distress at this time. I will continue to monitor this patient with purposefuly, hourly rounding.

## 2020-09-12 NOTE — TOC Initial Note (Addendum)
Transition of Care [facility]) - Initial/Assessment Note    Patient Details  Name: Jared Carlson MRN: [id number] Date of Birth: [birth year]  Transition of Care [facility]) CM/SW Contact:    Lanier Clam, RN Phone Number: 09/12/2020, 10:30 AM  Clinical Narrative: Noted no insurance;no pcp;per attending will d/c home on xarelto. Will have patient set up own [facility] appt,they will also provide an intake process to connect with resources for med asst for access to healthcare.                10:42a-[facility] @ patient care center appt set. 12:38p-[facility] pharmacy rep Tresa Endo can provide xarelto. MATCH program provided also. Patient can afford $10-cost for xarelto per Tresa Endo.   Expected Discharge Plan: Home/Self Care Barriers to Discharge: No Barriers Identified   Patient Goals and CMS Choice Patient states their goals for this hospitalization and ongoing recovery are:: go home CMS Medicare.gov Compare Post Acute Care list provided to:: Patient    Expected Discharge Plan and Services Expected Discharge Plan: Home/Self Care   Discharge Planning Services: CM Consult   Living arrangements for the past 2 months: Apartment,Single Family Home                                      Prior Living Arrangements/Services Living arrangements for the past 2 months: Apartment,Single Family Home Lives with:: Parents Patient language and need for interpreter reviewed:: Yes Do you feel safe going back to the place where you live?: Yes      Need for Family Participation in Patient Care: No (Comment) Care giver support system in place?: Yes (comment)   Criminal Activity/Legal Involvement Pertinent to Current Situation/Hospitalization: No - Comment as needed  Activities of Daily Living      Permission Sought/Granted Permission sought to share information with : Case Manager Permission granted to share information with : Yes, Verbal Permission Granted        Permission granted to share info w Relationship:  Alice mother [phone]     Emotional Assessment Appearance:: Appears stated age Attitude/Demeanor/Rapport: Gracious Affect (typically observed): Accepting Orientation: : Oriented to Self,Oriented to Place,Oriented to  Time,Oriented to Situation Alcohol / Substance Use: Not Applicable Psych Involvement: No (comment)  Admission diagnosis:  Pulmonary emboli (HCC) [I26.99] Acute pulmonary embolism without acute cor pulmonale, unspecified pulmonary embolism type (HCC) [I26.99] Patient Active Problem List   Diagnosis Date Noted  . Pulmonary emboli (HCC) 09/12/2020  . Lower extremity weakness 06/15/2020   PCP:  Patient, No Pcp Per Pharmacy:   [facility] - [place], [place] - [address] [place] [address] [place] [address] Phone: [phone] Fax: [phone]     Social Determinants of Health (SDOH) Interventions    Readmission Risk Interventions No flowsheet data found.

## 2020-09-12 NOTE — ED Provider Notes (Signed)
[facility] EMERGENCY DEPARTMENT Provider Note   CSN: [id number] Arrival date & time: 09/11/20  2302     History Chief Complaint  Patient presents with   Chest Pain    Jared Carlson is a 25 y.o. male.  Patient is a 25 year old male with history of prior DVT.  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  He was prescribed Xarelto, however it appears as though he never filled this prescription.  Patient presents today with complaints of pain to the right upper chest.  This started 2 days ago while he was donating plasma.  Patient describes a constant pain that is worse when he moves or breathes.  He feels short of breath.  He denies any leg pain or swelling.  He denies any fevers, chills, or cough.  The history is provided by the patient.  Chest Pain Pain location:  R chest Pain quality: sharp   Pain radiates to:  Does not radiate Pain severity:  Severe Onset quality:  Sudden Duration:  2 days Timing:  Constant Progression:  Worsening Chronicity:  New      Past Medical History:  Diagnosis Date   DVT (deep venous thrombosis) (HCC)     Patient Active Problem List   Diagnosis Date Noted   Lower extremity weakness 06/15/2020    History reviewed. No pertinent surgical history.     Family History  Problem Relation Age of Onset   Healthy Mother    Healthy Father     Social History   Tobacco Use   Smoking status: Never Smoker   Smokeless tobacco: Never Used  Building services engineer Use: Never used  Substance Use Topics   Alcohol use: Yes   Drug use: Yes    Frequency: 7.0 times per week    Types: Marijuana    Home Medications Prior to Admission medications   Medication Sig Start Date End Date Taking? Authorizing Provider  RIVAROXABAN Carlena Hurl) VTE STARTER PACK (15 & 20 MG) Follow package directions: Take one 15mg  tablet by mouth twice a day. On day 22, switch to one 20mg  tablet once a day. Take  with food. 06/28/20   Molpus, , MD    Allergies    Patient has no known allergies.  Review of Systems   Review of Systems  Cardiovascular: Positive for chest pain.  All other systems reviewed and are negative.   Physical Exam Updated Vital Signs BP (!) 141/102    Pulse 82    Temp 99.2 F (37.3 C) (Oral)    Resp 20    Ht 5\' 9"  (1.753 m)    Wt 113.4 kg    SpO2 96%    BMI 36.92 kg/m   Physical Exam Vitals and nursing note reviewed.  Constitutional:      General: He is not in acute distress.    Appearance: He is well-developed. He is not diaphoretic.  HENT:     Head: Normocephalic and atraumatic.  Cardiovascular:     Rate and Rhythm: Normal rate and regular rhythm.     Heart sounds: No murmur heard. No friction rub.  Pulmonary:     Effort: Pulmonary effort is normal. No respiratory distress.     Breath sounds: Normal breath sounds. No wheezing or rales.  Abdominal:     General: Bowel sounds are normal. There is no distension.     Palpations: Abdomen is soft.     Tenderness: There is no  abdominal tenderness.  Musculoskeletal:        General: Normal range of motion.     Cervical back: Normal range of motion and neck supple.     Right lower leg: No tenderness. No edema.     Left lower leg: No tenderness. No edema.  Skin:    General: Skin is warm and dry.  Neurological:     Mental Status: He is alert and oriented to person, place, and time.     Coordination: Coordination normal.     ED Results / Procedures / Treatments   Labs (all labs ordered are listed, but only abnormal results are displayed) Labs Reviewed  BASIC METABOLIC PANEL - Abnormal; Notable for the following components:      Result Value   CO2 19 (*)    Glucose, Bld 140 (*)    All other components within normal limits  CBC - Abnormal; Notable for the following components:   WBC 13.2 (*)    All other components within normal limits  TROPONIN I (HIGH SENSITIVITY)  TROPONIN I (HIGH SENSITIVITY)     EKG EKG Interpretation  Date/Time:  Thursday September 11 2020 23:16:11 EDT Ventricular Rate:  62 PR Interval:  136 QRS Duration: 84 QT Interval:  368 QTC Calculation: 373 R Axis:   89 Text Interpretation: Normal sinus rhythm Normal ECG Confirmed by Geoffery Lyons ([id number]) on 09/11/2020 11:48:24 PM   Radiology DG Chest 2 View  Result Date: 09/11/2020 CLINICAL DATA:  Chest pain and shortness of breath. EXAM: CHEST - 2 VIEW COMPARISON:  Chest CTA  06/11/2020 FINDINGS: Patchy peripheral opacity at the right lung base, likely localizing to the right middle lobe. Left lung is clear. Overall low lung volumes. Normal heart size and mediastinal contours. No pneumothorax. No large pleural effusion. No acute osseous abnormalities are seen. IMPRESSION: 1. Patchy right lung base opacity, may represent pneumonia in the appropriate clinical setting. Recommend correlation for infectious symptoms. 2. Pulmonary infarct could have a similar radiographic appearance. Consider chest CTA based on clinical concern. Electronically Signed   By: Narda Rutherford M.D.   On: 09/11/2020 23:39    Procedures Procedures   Medications Ordered in ED Medications  morphine 4 MG/ML injection 4 mg (has no administration in time range)    ED Course  I have reviewed the triage vital signs and the nursing notes.  Pertinent labs & imaging results that were available during my care of the patient were reviewed by me and considered in my medical decision making (see chart for details).    MDM Rules/Calculators/A&P  Patient with history of prior DVT presenting with complaints of right-sided chest pain for the past 2 days.  Initial EKG showing no abnormalities, but chest x-ray does show possible pulmonary infarct.  CTA was obtained of the chest showing a sizable right-sided pulmonary embolism along with pulmonary infarct.  There is also a clot noted in the left lung, but no heart strain.  Patient to be admitted to the  hospitalist service for anticoagulation and further observation.  It sounds as though a recurrent DVT was diagnosed in January, however the patient had not filled the Xarelto prescription due to financial reasons.  CRITICAL CARE Performed by: Geoffery Lyons Total critical care time: 35 minutes Critical care time was exclusive of separately billable procedures and treating other patients. Critical care was necessary to treat or prevent imminent or life-threatening deterioration. Critical care was time spent personally by me on the following activities: development of treatment plan with  patient and/or surrogate as well as nursing, discussions with consultants, evaluation of patient's response to treatment, examination of patient, obtaining history from patient or surrogate, ordering and performing treatments and interventions, ordering and review of laboratory studies, ordering and review of radiographic studies, pulse oximetry and re-evaluation of patient's condition.   Final Clinical Impression(s) / ED Diagnoses Final diagnoses:  None    Rx / DC Orders ED Discharge Orders    None       Geoffery Lyons, MD 09/12/20 [phone]

## 2020-09-12 NOTE — ED Notes (Addendum)
Pt transported to CT ?

## 2020-09-12 NOTE — Progress Notes (Signed)
ANTICOAGULATION CONSULT NOTE - Initial Consult  Pharmacy Consult for Heparin Indication: pulmonary embolus  No Known Allergies  Patient Measurements: Height: 5\' 9"  (175.3 cm) Weight: 113.4 kg (250 lb) IBW/kg (Calculated) : 70.7 Heparin Dosing Weight: 97 kg  Vital Signs: Temp: 99.2 F (37.3 C) (03/17 2308) Temp Source: Oral (03/17 2308) BP: 142/70 (03/18 0134) Pulse Rate: 98 (03/18 0134)  Labs: Recent Labs    09/11/20 2335  HGB 16.7  HCT 48.4  PLT 192  CREATININE 1.05  TROPONINIHS 3    Estimated Creatinine Clearance: 135.9 mL/min (by C-G formula based on SCr of 1.05 mg/dL).   Medical History: Past Medical History:  Diagnosis Date  . DVT (deep venous thrombosis) (HCC)     Assessment: 25 y.o. M presents with PE. CT shows extensive bilateral PE. No definite R heart strain. Pt was supposed to be on Xarelto since January for DVT but due to cost never started this medication. CBC stable. To begin heparin per pharmacy.  Goal of Therapy:  Heparin level 0.3-0.7 units/ml Monitor platelets by anticoagulation protocol: Yes   Plan:  Heparin IV bolus 6000 units Heparin gtt at 1700 units/hr Will f/u heparin level in 6 hours Daily heparin level and CBC  February, PharmD, BCPS Please see amion for complete clinical pharmacist phone list 09/12/2020,2:25 AM

## 2020-09-12 NOTE — ED Notes (Signed)
Pt returned to room  

## 2020-09-12 NOTE — ED Notes (Signed)
Patient stated he was in pain, RR in the 30's. SAT was 94% but placed on 2L for comfort. Instructed to try to slow breathing down. RR now in the 20's and SAT 99%

## 2020-09-12 NOTE — Progress Notes (Signed)
Pt admitted to 1513 via [facility] transport from [facility]. Pt has PIV with Heparin gtt running without complication. Pt is no apparent distress. Pt was oriented to room, bed, and call light. Telemetry placed and verified with PCT Hunter. No new orders at this time but MD Ronaldo Miyamoto notified via [facility] page that pt has arrived to unit. Bed is in lowest and locked position, call light is within reach, and pt confirmed he will call before moving OOB independently. I will continue to monitor this patient with hourly, purposeful rounding.

## 2020-09-13 ENCOUNTER — Inpatient Hospital Stay (HOSPITAL_COMMUNITY): Payer: Self-pay

## 2020-09-13 ENCOUNTER — Other Ambulatory Visit: Payer: Self-pay | Admitting: Oncology

## 2020-09-13 DIAGNOSIS — I82402 Acute embolism and thrombosis of unspecified deep veins of left lower extremity: Secondary | ICD-10-CM

## 2020-09-13 DIAGNOSIS — R0781 Pleurodynia: Secondary | ICD-10-CM

## 2020-09-13 DIAGNOSIS — I2602 Saddle embolus of pulmonary artery with acute cor pulmonale: Secondary | ICD-10-CM

## 2020-09-13 DIAGNOSIS — I2699 Other pulmonary embolism without acute cor pulmonale: Principal | ICD-10-CM

## 2020-09-13 LAB — COMPREHENSIVE METABOLIC PANEL
ALT: 12 U/L (ref 0–44)
AST: 15 U/L (ref 15–41)
Albumin: 3.5 g/dL (ref 3.5–5.0)
Alkaline Phosphatase: 78 U/L (ref 38–126)
Anion gap: 12 (ref 5–15)
BUN: 7 mg/dL (ref 6–20)
CO2: 23 mmol/L (ref 22–32)
Calcium: 8.9 mg/dL (ref 8.9–10.3)
Chloride: 100 mmol/L (ref 98–111)
Creatinine, Ser: 1.16 mg/dL (ref 0.61–1.24)
GFR, Estimated: >60 mL/min (ref >60)
Glucose, Bld: 101 mg/dL — ABNORMAL HIGH (ref 70–99)
Potassium: 3.7 mmol/L (ref 3.5–5.1)
Sodium: 135 mmol/L (ref 135–145)
Total Bilirubin: 2.1 mg/dL — ABNORMAL HIGH (ref 0.3–1.2)
Total Protein: 7.2 g/dL (ref 6.5–8.1)

## 2020-09-13 LAB — ANTITHROMBIN III: AntiThromb III Func: 87 % (ref 75–120)

## 2020-09-13 LAB — CBC
HCT: 46.4 % (ref 39.0–52.0)
Hemoglobin: 15.6 g/dL (ref 13.0–17.0)
MCH: 30.5 pg (ref 26.0–34.0)
MCHC: 33.6 g/dL (ref 30.0–36.0)
MCV: 90.8 fL (ref 80.0–100.0)
Platelets: 179 10*3/uL (ref 150–400)
RBC: 5.11 MIL/uL (ref 4.22–5.81)
RDW: 13.3 % (ref 11.5–15.5)
WBC: 12.8 10*3/uL — ABNORMAL HIGH (ref 4.0–10.5)
nRBC: 0 % (ref 0.0–0.2)

## 2020-09-13 LAB — ECHOCARDIOGRAM COMPLETE
Area-P 1/2: 2.95 cm2
Height: 69 in
S' Lateral: 3 cm
Weight: 4000 oz

## 2020-09-13 LAB — HEPARIN LEVEL (UNFRACTIONATED)
Heparin Unfractionated: 0.3 IU/mL (ref 0.30–0.70)
Heparin Unfractionated: 0.28 IU/mL — ABNORMAL LOW (ref 0.30–0.70)
Heparin Unfractionated: 0.37 IU/mL (ref 0.30–0.70)

## 2020-09-13 LAB — PROTIME-INR
INR: 1.3 — ABNORMAL HIGH (ref 0.8–1.2)
Prothrombin Time: 15.2 seconds (ref 11.4–15.2)

## 2020-09-13 MED ORDER — WARFARIN SODIUM 5 MG PO TABS
10.0000 mg | ORAL_TABLET | Freq: Once | ORAL | Status: AC
  Administered 2020-09-13: 10 mg via ORAL
  Filled 2020-09-13: qty 2

## 2020-09-13 MED ORDER — HEPARIN (PORCINE) 25000 UT/250ML-% IV SOLN
1550.0000 [IU]/h | INTRAVENOUS | Status: AC
  Administered 2020-09-14 (×2): 1550 [IU]/h via INTRAVENOUS
  Filled 2020-09-13 (×4): qty 250

## 2020-09-13 MED ORDER — COUMADIN BOOK
Freq: Once | Status: AC
  Filled 2020-09-13: qty 1

## 2020-09-13 MED ORDER — MORPHINE SULFATE (PF) 2 MG/ML IV SOLN
2.0000 mg | INTRAVENOUS | Status: DC | PRN
  Filled 2020-09-13: qty 1

## 2020-09-13 MED ORDER — WARFARIN VIDEO: Freq: Once | Status: DC

## 2020-09-13 MED ORDER — KETOROLAC TROMETHAMINE 30 MG/ML IJ SOLN
30.0000 mg | Freq: Once | INTRAMUSCULAR | Status: AC
  Administered 2020-09-13: 30 mg via INTRAVENOUS
  Filled 2020-09-13: qty 1

## 2020-09-13 MED ORDER — KETOROLAC TROMETHAMINE 30 MG/ML IJ SOLN
30.0000 mg | Freq: Three times a day (TID) | INTRAMUSCULAR | Status: DC | PRN
  Administered 2020-09-13 – 2020-09-14 (×2): 30 mg via INTRAVENOUS
  Filled 2020-09-13 (×3): qty 1

## 2020-09-13 MED ORDER — WARFARIN - PHARMACIST DOSING INPATIENT: Freq: Every day | Status: DC

## 2020-09-13 NOTE — Plan of Care (Signed)
  Problem: Education: Goal: Knowledge of General Education information will improve Description: Including pain rating scale, medication(s)/side effects and non-pharmacologic comfort measures Outcome: Progressing   Problem: Clinical Measurements: Goal: Ability to maintain clinical measurements within normal limits will improve Outcome: Progressing Goal: Respiratory complications will improve Outcome: Progressing   Problem: Pain Managment: Goal: General experience of comfort will improve Outcome: Progressing   

## 2020-09-13 NOTE — Progress Notes (Signed)
  Echocardiogram 2D Echocardiogram has been performed.  Veryl Winemiller G Torin Whisner 09/13/2020, 11:04 AM

## 2020-09-13 NOTE — Progress Notes (Addendum)
ANTICOAGULATION CONSULT NOTE - Follow Up Consult  Pharmacy Consult for Heparin + Warfarin  Indication: pulmonary embolus  No Known Allergies  Patient Measurements: Height: 5\' 9"  (175.3 cm) Weight: 113.4 kg (250 lb) IBW/kg (Calculated) : 70.7 Heparin Dosing Weight: 96 kg  Vital Signs: Temp: 99.6 F (37.6 C) (03/19 1012) Temp Source: Oral (03/19 1012) BP: 154/75 (03/19 1012) Pulse Rate: 95 (03/19 1012)  Labs: Recent Labs    09/11/20 2335 09/12/20 0131 09/12/20 0900 09/12/20 1115 09/12/20 2005 09/13/20 0521  HGB 16.7  --   --  16.1  --  15.6  HCT 48.4  --   --  47.4  --  46.4  PLT 192  --   --  191  --  179  HEPARINUNFRC  --   --    < > 1.10* 0.54 0.30  CREATININE 1.05  --   --  1.15  --  1.16  TROPONINIHS 3 3  --   --   --   --    < > = values in this interval not displayed.    Estimated Creatinine Clearance: 123 mL/min (by C-G formula based on SCr of 1.16 mg/dL).   Assessment: 25 y/o M presents with PE. CT shows extensive bilateral PE. No definite R heart strain. CBC stable. To begin heparin per pharmacy. Patient has history of DVTs. Pt filled and picked up a Xarelto starter pack in Aug 2020 (using the $0 copay discount card) but never filled the maintenance dose tablets. He was also prescribed a Xarelto starter pack in January for DVT but due to cost has not picked up this medication.  Today, 09/13/20:  AM heparin level = 0.3 units/mL, on lower end of therapeutic range  CBC: H/H, Pltc WNL   No bleeding or complications reported  Heme/Onc consulted and recommended warfarin. Pharmacy consulted for dosing.   Hypercoagulable work-up ordered by Heme/Onc   Goal of Therapy:  Heparin level 0.3-0.7 units/ml  INR 2-3 Monitor platelets by anticoagulation protocol: Yes   Plan:  Continue heparin infusion at 1400 units/hr  Heparin level at noon to confirm remains within therapeutic range  Warfarin 10mg  PO x 1  Daily CBC, heparin level, PT/INR Patient will need  minimum overlap of warfarin with heparin (or lovenox) x 5 days and INR therapeutic x at least 24 hours  Pharmacy to provide education on warfarin therapy prior to discharge    09/15/20, PharmD, BCPS Clinical Pharmacist 09/13/2020 10:34 AM   Addendum: 1151 heparin level = 0.28 units/mL, now slightly subtherapeutic. No bleeding or infusion issues per nursing.  Plan: Increase heparin infusion to 1550 units/hr  Heparin level 6 hours after rate change Warfarin dosing and other labs as above  Greer Pickerel, PharmD, BCPS Clinical Pharmacist  09/13/2020 12:54 PM   Addendum:  Greer Pickerel heparin level = 0.37 units/mL, now therapeutic.  Plan: Continue heparin infusion at 1550 units/hr Heparin level in 6 hours to ensure remains within therapeutic range Daily CBC, heparin level, PT/INR   09/15/2020, PharmD, BCPS Clinical Pharmacist  09/13/2020 7:58 PM

## 2020-09-13 NOTE — Progress Notes (Signed)
PROGRESS NOTE    Jared Carlson  FYB:[id number] DOB: 02/11/1996 DOA: 09/11/2020 PCP: Patient, No Pcp Per   Brief Narrative:   Patient is a 25 y.o. male with medical history significant of DVT's presenting with right chest pain which started a couple of days ago which he described as sharp, non-radiating pain in his right chest that began while donating blood.  Patient reports trying to ignore his initial symptoms after plasma donation on the first day.  However, on 09/10/20, his chest pain symptoms were severe enough that he could no longer ignore it. He had a difficult time getting comfortable and had increased difficulty breathing along with difficulty sleeping.  His symptoms progressed through the night on 09/10/20-09/11/20 and he decided to go to the ED He denies any other alleviating or aggravating factors.  The patient reports donating plasma since 2018 and also self reports having a total of 6 DVT's over the past 2 years. His most recent DVT was diagnosed in January 2022. He was started on Xarelto but only completed the "starter pack" given to him in the hospital and stopped the medication after the starter pack pills were gone due to cost of refilling the medication.  The patient reports that the same situation happened during his next to last DVT where he completed the "starter pack" but could not continue the medication due to cost.  Patient reports that he would be agreeable to taking coumadin (warfarin) and would be able to have lab draws done to monitor levels.               ED Course: CTA shows extensive PE and was started on heparin gtt.  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 He presented again to the ED 05/2020 with similar symptoms however, no LLE DVT was  found. -In Jan 2022, patient had second documented DVT, and again was placed on Xarelto. -Now on current admission, was found to have an extensive bilateral PE.  -TOC and patient assistance notified along with referral to [facility]. -Troponins x2 negative; BNP 45.5, WNL; Echo, complete, results pending, EKG: NSR -Patient is not considered to have medication failure on Xarelto since it was discontinued due to cost.  However, a more cost effective anticoagulant such as warfarin (Coudamin) is likely to have better medication compliance. The patient said that he would take his medications if he can afford it and is agreeable to having blood work monitoring as outpatient. -Oncology consulted with hypercoagulability panel ordered. -Transition heparin gtt-coumadin po bridge with first dose of coumadin today. -Toradol for continued pleuritic pain.    DVT prophylaxis: heparin gtt Code Status: Full Family Communication:  None at bedside  Status is: Inpatient   Dispo: The patient is from: Home              Anticipated d/c is to: Home              Patient currently is not medically stable for d/c.   Difficult to place patient: No   Body mass index is 36.92 kg/m.   Consultants:   Pharmacy (heparin dosing)  Oncology  Procedures:   None  Antimicrobials:   None   Subjective:  Patient seen and examined at bedside. Patient says that his chest  still hurts on his right side.  Examination:  General exam: Resting in bed, NAD. Respiratory system: Clear to auscultation. Respiratory effort normal. Cardiovascular system: S1 & S2 heard, RRR. No JVD, murmurs, rubs, gallops or clicks. No pedal edema. Gastrointestinal system: Abdomen is nondistended, soft and nontender. No organomegaly or masses felt. Normal bowel sounds heard. Central nervous system: Alert and oriented. No focal neurological deficits. Extremities: Symmetric 5 x 5 power. Skin: No rashes, lesions or  ulcers Psychiatry: Judgement and insight appear normal. Mood & affect appropriate.     Objective: Vitals:   09/12/20 1814 09/12/20 2157 09/13/20 0457 09/13/20 1012  BP: 131/86 119/63 (!) 152/77 (!) 154/75  Pulse: 87 99 83 95  Resp: (!) 33  20 (!) 22  Temp: 99.5 F (37.5 C) (!) 100.4 F (38 C) 99.4 F (37.4 C) 99.6 F (37.6 C)  TempSrc: Oral Oral Oral Oral  SpO2: 99% 100% 100% 100%  Weight:      Height:        Intake/Output Summary (Last 24 hours) at 09/13/2020 1031 Last data filed at 09/12/2020 1502 Gross per 24 hour  Intake 15.39 ml  Output -  Net 15.39 ml   Filed Weights   09/11/20 2315  Weight: 113.4 kg     Data Reviewed:   CBC: Recent Labs  Lab 09/11/20 2335 09/12/20 1115 09/13/20 0521  WBC 13.2* 14.2* 12.8*  NEUTROABS  --  10.6*  --   HGB 16.7 16.1 15.6  HCT 48.4 47.4 46.4  MCV 88.5 89.4 90.8  PLT 192 191 179   Basic Metabolic Panel: Recent Labs  Lab 09/11/20 2335 09/12/20 1115 09/13/20 0521  NA 136 138 135  K 3.9 3.7 3.7  CL 105 106 100  CO2 19* 23 23  GLUCOSE 140* 128* 101*  BUN 7 5* 7  CREATININE 1.05 1.15 1.16  CALCIUM 9.1 9.2 8.9   GFR: Estimated Creatinine Clearance: 123 mL/min (by C-G formula based on SCr of 1.16 mg/dL). Liver Function Tests: Recent Labs  Lab 09/12/20 1115 09/13/20 0521  AST 16 15  ALT 12 12  ALKPHOS 91 78  BILITOT 1.7* 2.1*  PROT 7.7 7.2  ALBUMIN 3.7 3.5   No results for input(s): LIPASE, AMYLASE in the last 168 hours. No results for input(s): AMMONIA in the last 168 hours. Coagulation Profile: No results for input(s): INR, PROTIME in the last 168 hours. Cardiac Enzymes: No results for input(s): CKTOTAL, CKMB, CKMBINDEX, TROPONINI in the last 168 hours. BNP (last 3 results) No results for input(s): PROBNP in the last 8760 hours. HbA1C: No results for input(s): HGBA1C in the last 72 hours. CBG: No results for input(s): GLUCAP in the last 168 hours. Lipid Profile: No results for input(s): CHOL, HDL,  LDLCALC, TRIG, CHOLHDL, LDLDIRECT in the last 72 hours. Thyroid Function Tests: No results for input(s): TSH, T4TOTAL, FREET4, T3FREE, THYROIDAB in the last 72 hours. Anemia Panel: No results for input(s): VITAMINB12, FOLATE, FERRITIN, TIBC, IRON, RETICCTPCT in the last 72 hours. Sepsis Labs: No results for input(s): PROCALCITON, LATICACIDVEN in the last 168 hours.  Recent Results (from the past 240 hour(s))  Resp Panel by RT-PCR (Flu A&B, Covid) Nasopharyngeal Swab     Status: None   Collection Time: 09/12/20  2:50 AM   Specimen: Nasopharyngeal Swab; Nasopharyngeal(NP) swabs in vial transport medium  Result Value Ref Range Status   SARS Coronavirus 2 by RT PCR NEGATIVE NEGATIVE Final    Comment: (NOTE) SARS-CoV-2 target nucleic acids are NOT DETECTED.  The SARS-CoV-2 RNA is generally detectable in upper respiratory specimens during the acute phase of infection. The lowest concentration of SARS-CoV-2 viral copies this assay can detect is 138 copies/mL. A negative result does not preclude SARS-Cov-2 infection and should not be used as the sole basis for treatment or other patient management decisions. A negative result may occur with  improper specimen collection/handling, submission of specimen other than nasopharyngeal swab, presence of viral mutation(s) within the areas targeted by this assay, and inadequate number of viral copies(<138 copies/mL). A negative result must be combined with clinical observations, patient history, and epidemiological information. The expected result is Negative.  Fact Sheet for Patients:  BloggerCourse.com  Fact Sheet for Healthcare Providers:  SeriousBroker.it  This test is no t yet approved or cleared by the [place] FDA and  has been authorized for detection and/or diagnosis of SARS-CoV-2 by FDA under an Emergency Use Authorization (EUA). This EUA will remain  in effect (meaning this test  can be used) for the duration of the COVID-19 declaration under Section 564(b)(1) of the Act, 21 U.S.C.section 360bbb-3(b)(1), unless the authorization is terminated  or revoked sooner.       Influenza A by PCR NEGATIVE NEGATIVE Final   Influenza B by PCR NEGATIVE NEGATIVE Final    Comment: (NOTE) The Xpert Xpress SARS-CoV-2/FLU/RSV plus assay is intended as an aid in the diagnosis of influenza from Nasopharyngeal swab specimens and should not be used as a sole basis for treatment. Nasal washings and aspirates are unacceptable for Xpert Xpress SARS-CoV-2/FLU/RSV testing.  Fact Sheet for Patients: BloggerCourse.com  Fact Sheet for Healthcare Providers: SeriousBroker.it  This test is not yet approved or cleared by the [place] FDA and has been authorized for detection and/or diagnosis of SARS-CoV-2 by FDA under an Emergency Use Authorization (EUA). This EUA will remain in effect (meaning this test can be used) for the duration of the COVID-19 declaration under Section 564(b)(1) of the Act, 21 U.S.C. section 360bbb-3(b)(1), unless the authorization is terminated or revoked.  Performed at [facility], [address], [place], [place] [address]          Radiology Studies: DG Chest 2 View  Result Date: 09/11/2020 CLINICAL DATA:  Chest pain and shortness of breath. EXAM: CHEST - 2 VIEW COMPARISON:  Chest CTA  06/11/2020 FINDINGS: Patchy peripheral opacity at the right lung base, likely localizing to the right middle lobe. Left lung is clear. Overall low lung volumes. Normal heart size and mediastinal contours. No pneumothorax. No large pleural effusion. No acute osseous abnormalities are seen. IMPRESSION: 1. Patchy right lung base opacity, may represent pneumonia in the appropriate clinical setting. Recommend correlation for infectious symptoms. 2. Pulmonary infarct could have a similar radiographic appearance.  Consider chest CTA based on clinical concern. Electronically Signed   By: Narda Rutherford M.D.   On: 09/11/2020 23:39   CT Angio Chest PE W and/or Wo Contrast  Result Date: 09/12/2020 CLINICAL DATA:  Right-sided chest pain. Shortness of breath. History of DVT. Pulmonary embolus suspected. EXAM: CT ANGIOGRAPHY CHEST WITH CONTRAST TECHNIQUE: Multidetector CT imaging of the chest was performed using the standard protocol during bolus administration of intravenous contrast. Multiplanar CT image reconstructions and MIPs were obtained to evaluate the vascular anatomy. CONTRAST:  OMNIPAQUE IOHEXOL 350 MG/ML SOLN COMPARISON:  Chest x-ray 09/11/2020, CT angio 06/11/2020 FINDINGS: Cardiovascular: Poor opacification of the pulmonary arteries to the segmental level. Central right pulmonary emboli extending to all lobes at the segmental and  subsegmental levels. Segmental left lower and upper lobe pulmonary emboli extending to the subsegmental level. The main pulmonary artery is normal in caliber. Normal heart size. No increased right to left ventricular ratio. No straightening or paradoxical bowing of the interventricular septum. No significant pericardial effusion. The thoracic aorta is normal in caliber. No atherosclerotic plaque of the thoracic aorta. No coronary artery calcifications. Mediastinum/Nodes: No enlarged mediastinal, hilar, or axillary lymph nodes. Thyroid gland, trachea, and esophagus demonstrate no significant findings. Lungs/Pleura: There is a peripheral wedge-shaped ground-glass bubbly appearing opacity within the right lower lobe that likely represents a pulmonary infarction. Stable pulmonary micronodule within left lower lobe (5:41). No pleural effusion. No pneumothorax. Upper Abdomen: No acute abnormality. Musculoskeletal: No chest wall abnormality. No acute or significant osseous findings. Review of the MIP images confirms the above findings. IMPRESSION: 1. Extensive pulmonary emboli bilaterally  with associated right lower lobe pulmonary infarction. No definite right heart strain. 2. Central right pulmonary emboli extending to all lobes at the segmental and subsegmental levels. Segmental and subsegmental left lower and upper lobe pulmonary emboli. These results were called by telephone at the time of interpretation on 09/12/2020 at 2:12 am to provider Geoffery Lyons , who verbally acknowledged these results. Electronically Signed   By: Tish Frederickson M.D.   On: 09/12/2020 02:18        Scheduled Meds: . docusate sodium  100 mg Oral BID   Continuous Infusions: . heparin 1,400 Units/hr (09/12/20 1502)     LOS: 1 day   Time spent= 35 mins    Shearon Balo, RN, NP student    If 7PM-7AM, please contact night-coverage  09/13/2020, 10:31 AM

## 2020-09-13 NOTE — Progress Notes (Signed)
Found patient's girlfriend in bathroom laying on the floor. Escorted patient's girlfriend out of the building. Reiterated the visitation policy timeframe. Made House supervisor aware of situation. Will continue to  monitor.  Will make the attending on call  MD aware of incident.

## 2020-09-13 NOTE — Progress Notes (Signed)
[facility]  Telephone:([phone]) [phone] [phone]) [phone]     ID: Jared Carlson DOB: 1996-05-16  MR#: [id number]  HYQ#:[id number]  Patient Care Team: Patient, No Pcp Per as PCP - General (General Practice) Jared Cruel, MD OTHER MD:  CHIEF COMPLAINT: Recurrent unprovoked venous clots  CURRENT TREATMENT: To transition from heparin to warfarin   HISTORY OF CURRENT ILLNESS: Jared Carlson tells me he had his first deep venous thrombosis to the left leg in August 2020.  We have a note from the emergency room here 02/25/2019 documenting that the patient had been recently diagnosed with DVT and had been started on Xarelto.  However the patient was unable to continue to afford that medication  The patient tells me there was an episode of deep venous thrombosis in the opposite, right leg April 2021 but I do not find that in the records here and there are no outside records in our system.  On June 11, 2020 he presented to the emergency room here with a complaint of leg swelling and Dopplers were negative bilaterally.  The patient tells me he had passed out and neurology consult was obtained as well as extensive work-up.  Please consult Dr. Ansel Carlson note 06/15/2020 for details.  In brief: "25 y.o. male [facility] with episodes of blacking out of unknown duration had his typical event and woke with bilateral lower extremity flaccid paralysis, no sensory from the knees down and normal reflexes without bowel/bladder involvement. CTV brain did not show venous sinus thrombosis. MRI lumbar spine only showed mild foraminal stenoses. Curious constellation of symptoms and exam which are significantly out of proportion to imaging findings."  Additional studies included an EEG on 06/15/2020 which was read as normal with no evidence of seizures.  Outpatient EMG/NCS was recommended.  I have no records these were performed  After discharge 06/17/2020 the patient return to the  emergency room 06/28/2020 with complaints of left lower leg pain and swelling.  Dopplers 06/28/2020 confirmed an acute DVT involving the tibial peroneal trunk and popliteal vein on the right.  CT angio of the chest 06/11/2020 was negative.  The patient was restarted on Xarelto and discharged.  On 09/12/2020 the patient again presented to the emergency room, this time complaining of pain to the right chest, worsening problems walking, and also passing out once in the last couple of weeks.  CT angio of the chest 09/12/2020 now shows extensive pulmonary emboli bilaterally, with associated right lower lobe pulmonary infarction.  There was no definite right heart strain.  We were consulted regarding further evaluation and management  The patient's subsequent history is as detailed below.  INTERVAL HISTORY: I met with the patient in his hospital room 09/14/2019.  No family was present  REVIEW OF SYSTEMS: The patient tells me that he still has significant pain.  His breathing is better.  He does have a cough.  He is not bringing up any blood or phlegm.  He denies fevers adenopathy, unexplained weight loss or unexplained fatigue.  There had not been any drenching sweats.  At the most recent syncopal episode he said he passed out after only 1 drink into allergy pills.  He denies use of cocaine, heroin or other narcotics.  Admits to occasional marijuana use which is also documented in the record.  COVID 19 VACCINATION STATUS: Patient states he sat [name] x2 with the booster July 2021  PAST MEDICAL HISTORY: Past Medical History:  Diagnosis Date  . DVT (deep venous thrombosis) (  [place])     PAST SURGICAL HISTORY: History reviewed. No pertinent surgical history.  FAMILY HISTORY Family History  Problem Relation Age of Onset  . Healthy Mother   . Healthy Father   The patient's mother is 43 years old as of March 2022.  She lives in [place].  She has 1 brother with a history of clotting, in the setting  of diabetes.  The patient tells me his father died from an overdose at some point in the past.  He has half siblings on his father sides but has no information on them.  On his mother side he is an only child   SOCIAL HISTORY:  [place] lives with Jared Carlson, his significant other, who is a Ship broker and works in a Proofreader.  He himself works at [employer].  He has no children.    ADVANCED DIRECTIVES: Not in place.  As to who he would name if he had a healthcare power of attorney he named his significant other Jared Carlson.   HEALTH MAINTENANCE: Social History   Tobacco Use  . Smoking status: Never Smoker  . Smokeless tobacco: Never Used  Vaping Use  . Vaping Use: Never used  Substance Use Topics  . Alcohol use: Yes  . Drug use: Yes    Frequency: 7.0 times per week    Types: Marijuana     Colonoscopy:  PAP:  Bone density:   No Known Allergies  Current Facility-Administered Medications  Medication Dose Route Frequency Provider Last Rate Last Admin  . acetaminophen (TYLENOL) tablet 650 mg  650 mg Oral Q6H PRN Marylyn Ishihara, Tyrone A, DO       Or  . acetaminophen (TYLENOL) suppository 650 mg  650 mg Rectal Q6H PRN Marylyn Ishihara, Tyrone A, DO      . docusate sodium (COLACE) capsule 100 mg  100 mg Oral BID Marylyn Ishihara, Tyrone A, DO   100 mg at 09/12/20 2213  . heparin ADULT infusion 100 units/mL (25000 units/240m)  1,400 Units/hr Intravenous Continuous [place], CHaze Justin RPH 14 mL/hr at 09/12/20 1502 1,400 Units/hr at 09/12/20 1502  . HYDROcodone-acetaminophen (NORCO/VICODIN) 5-325 MG per tablet 1-2 tablet  1-2 tablet Oral Q4H PRN KMarylyn Ishihara Tyrone A, DO   2 tablet at 09/12/20 1120  . morphine 2 MG/ML injection 2 mg  2 mg Intravenous Q2H PRN KMarylyn Ishihara Tyrone A, DO   2 mg at 09/13/20 0454  . ondansetron (ZOFRAN) tablet 4 mg  4 mg Oral Q6H PRN KMarylyn Ishihara Tyrone A, DO       Or  . ondansetron (ZOFRAN) injection 4 mg  4 mg Intravenous Q6H PRN KMarylyn Ishihara Tyrone A, DO        OBJECTIVE: African-American male examined in  bed  Vitals:   09/12/20 2157 09/13/20 0457  BP: 119/63 (!) 152/77  Pulse: 99 83  Resp:  20  Temp: (!) 100.4 F (38 C) 99.4 F (37.4 C)  SpO2: 100% 100%     Body mass index is 36.92 kg/m.   Wt Readings from Last 3 Encounters:  09/11/20 250 lb (113.4 kg)  06/28/20 250 lb (113.4 kg)  03/14/19 259 lb 6.4 oz (117.7 kg)    Lymphatic: No cervical or supraclavicular adenopathy Lungs no rales or rhonchi Heart regular rate and rhythm Abd soft, nontender, positive bowel sounds Neuro: non-focal, well-oriented, appropriate affect   LAB RESULTS:  CMP     Component Value Date/Time   NA 135 09/13/2020 0521   K 3.7 09/13/2020 0521   CL 100 09/13/2020 0521  CO2 23 09/13/2020 0521   GLUCOSE 101 (H) 09/13/2020 0521   BUN 7 09/13/2020 0521   CREATININE 1.16 09/13/2020 0521   CALCIUM 8.9 09/13/2020 0521   PROT 7.2 09/13/2020 0521   ALBUMIN 3.5 09/13/2020 0521   AST 15 09/13/2020 0521   ALT 12 09/13/2020 0521   ALKPHOS 78 09/13/2020 0521   BILITOT 2.1 (H) 09/13/2020 0521   GFRNONAA >60 09/13/2020 0521   GFRAA >60 02/22/2019 1040    No results found for: TOTALPROTELP, ALBUMINELP, A1GS, A2GS, BETS, BETA2SER, GAMS, MSPIKE, SPEI  No results found for: KPAFRELGTCHN, LAMBDASER, KAPLAMBRATIO  Lab Results  Component Value Date   WBC 12.8 (H) 09/13/2020   NEUTROABS 10.6 (H) 09/12/2020   HGB 15.6 09/13/2020   HCT 46.4 09/13/2020   MCV 90.8 09/13/2020   PLT 179 09/13/2020    _0 @  No results found for: LABCA2  No components found for: DTHYHO887  No results for input(s): INR in the last 168 hours.  No results found for: LABCA2  No results found for: NZV728  No results found for: ASU015  No results found for: IFB379  No results found for: CA2729  No components found for: HGQUANT  No results found for: CEA1 / No results found for: CEA1   No results found for: AFPTUMOR  No results found for: CHROMOGRNA  No results found for: PSA1  Admission on  09/11/2020  Component Date Value Ref Range Status  . Sodium 09/11/2020 136  135 - 145 mmol/L Final  . Potassium 09/11/2020 3.9  3.5 - 5.1 mmol/L Final  . Chloride 09/11/2020 105  98 - 111 mmol/L Final  . CO2 09/11/2020 19* 22 - 32 mmol/L Final  . Glucose, Bld 09/11/2020 140* 70 - 99 mg/dL Final   Glucose reference range applies only to samples taken after fasting for at least 8 hours.  . BUN 09/11/2020 7  6 - 20 mg/dL Final  . Creatinine, Ser 09/11/2020 1.05  0.61 - 1.24 mg/dL Final  . Calcium 09/11/2020 9.1  8.9 - 10.3 mg/dL Final  . GFR, Estimated 09/11/2020 >60  >60 mL/min Final   Comment: (NOTE) Calculated using the CKD-EPI Creatinine Equation (2021)   . Anion gap 09/11/2020 12  5 - 15 Final   Performed at [facility], [address] [place], [place] [address]  . WBC 09/11/2020 13.2* 4.0 - 10.5 K/uL Final  . RBC 09/11/2020 5.47  4.22 - 5.81 MIL/uL Final  . Hemoglobin 09/11/2020 16.7  13.0 - 17.0 g/dL Final  . HCT 09/11/2020 48.4  39.0 - 52.0 % Final  . MCV 09/11/2020 88.5  80.0 - 100.0 fL Final  . MCH 09/11/2020 30.5  26.0 - 34.0 pg Final  . MCHC 09/11/2020 34.5  30.0 - 36.0 g/dL Final  . RDW 09/11/2020 13.3  11.5 - 15.5 % Final  . Platelets 09/11/2020 192  150 - 400 K/uL Final  . nRBC 09/11/2020 0.0  0.0 - 0.2 % Final   Performed at [facility], [address] [place], [place] [address]  . Troponin I (High Sensitivity) 09/11/2020 3  <18 ng/L Final   Comment: (NOTE) Elevated high sensitivity troponin I (hsTnI) values and significant  changes across serial measurements may suggest ACS but many other  chronic and acute conditions are known to elevate hsTnI results.  Refer to the "Links" section for chest pain algorithms and additional  guidance. Performed at [facility], [address] [place], [place]  [address]   . Troponin I (High Sensitivity) 09/12/2020 3  <18 ng/L Final   Comment: (NOTE) Elevated high sensitivity troponin I (hsTnI)  values and significant  changes across serial measurements may suggest ACS but many other  chronic and acute conditions are known to elevate hsTnI results.  Refer to the "Links" section for chest pain algorithms and additional  guidance. Performed at [facility], [address] [place], [place] [address]   . Heparin Unfractionated 09/12/2020 1.10* 0.30 - 0.70 IU/mL Final   Comment: RESULTS CONFIRMED BY MANUAL DILUTION (NOTE) If heparin results are below expected values, and patient dosage has  been confirmed, suggest follow up testing of antithrombin III levels. Performed at [facility], [place] [address] [place], [place] [address]   . SARS Coronavirus 2 by RT PCR 09/12/2020 NEGATIVE  NEGATIVE Final   Comment: (NOTE) SARS-CoV-2 target nucleic acids are NOT DETECTED.  The SARS-CoV-2 RNA is generally detectable in upper respiratory specimens during the acute phase of infection. The lowest concentration of SARS-CoV-2 viral copies this assay can detect is 138 copies/mL. A negative result does not preclude SARS-Cov-2 infection and should not be used as the sole basis for treatment or other patient management decisions. A negative result may occur with  improper specimen collection/handling, submission of specimen other than nasopharyngeal swab, presence of viral mutation(s) within the areas targeted by this assay, and inadequate number of viral copies(<138 copies/mL). A negative result must be combined with clinical observations, patient history, and epidemiological information. The expected result is Negative.  Fact Sheet for Patients:  EntrepreneurPulse.com.au  Fact Sheet for Healthcare Providers:  IncredibleEmployment.be  This test is no                          t yet approved or cleared by the [place] FDA and  has been authorized for detection and/or diagnosis of SARS-CoV-2 by FDA under an Emergency Use Authorization  (EUA). This EUA will remain  in effect (meaning this test can be used) for the duration of the COVID-19 declaration under Section 564(b)(1) of the Act, 21 U.S.C.section 360bbb-3(b)(1), unless the authorization is terminated  or revoked sooner.      . Influenza A by PCR 09/12/2020 NEGATIVE  NEGATIVE Final  . Influenza B by PCR 09/12/2020 NEGATIVE  NEGATIVE Final   Comment: (NOTE) The Xpert Xpress SARS-CoV-2/FLU/RSV plus assay is intended as an aid in the diagnosis of influenza from Nasopharyngeal swab specimens and should not be used as a sole basis for treatment. Nasal washings and aspirates are unacceptable for Xpert Xpress SARS-CoV-2/FLU/RSV testing.  Fact Sheet for Patients: EntrepreneurPulse.com.au  Fact Sheet for Healthcare Providers: IncredibleEmployment.be  This test is not yet approved or cleared by the [place] FDA and has been authorized for detection and/or diagnosis of SARS-CoV-2 by FDA under an Emergency Use Authorization (EUA). This EUA will remain in effect (meaning this test can be used) for the duration of the COVID-19 declaration under Section 564(b)(1) of the Act, 21 U.S.C. section 360bbb-3(b)(1), unless the authorization is terminated or revoked.  Performed at [facility], [address] [place], [place] [address]   . WBC 09/12/2020 14.2* 4.0 - 10.5 K/uL Final   WHITE COUNT CONFIRMED ON SMEAR  . RBC 09/12/2020 5.30  4.22 - 5.81 MIL/uL Final  . Hemoglobin 09/12/2020 16.1  13.0 - 17.0 g/dL Final  . HCT 09/12/2020 47.4  39.0 - 52.0 % Final  .  MCV 09/12/2020 89.4  80.0 - 100.0 fL Final  . MCH 09/12/2020 30.4  26.0 - 34.0 pg Final  . MCHC 09/12/2020 34.0  30.0 - 36.0 g/dL Final  . RDW 09/12/2020 13.4  11.5 - 15.5 % Final  . Platelets 09/12/2020 191  150 - 400 K/uL Final  . nRBC 09/12/2020 0.0  0.0 - 0.2 % Final  . Neutrophils Relative % 09/12/2020 74  % Final  . Neutro Abs 09/12/2020 10.6* 1.7 - 7.7 K/uL  Final  . Lymphocytes Relative 09/12/2020 15  % Final  . Lymphs Abs 09/12/2020 2.1  0.7 - 4.0 K/uL Final  . Monocytes Relative 09/12/2020 11  % Final  . Monocytes Absolute 09/12/2020 1.5* 0.1 - 1.0 K/uL Final  . Eosinophils Relative 09/12/2020 0  % Final  . Eosinophils Absolute 09/12/2020 0.0  0.0 - 0.5 K/uL Final  . Basophils Relative 09/12/2020 0  % Final  . Basophils Absolute 09/12/2020 0.0  0.0 - 0.1 K/uL Final  . Immature Granulocytes 09/12/2020 0  % Final  . Abs Immature Granulocytes 09/12/2020 0.05  0.00 - 0.07 K/uL Final   Performed at [facility], [place] [address] [place], [place] [address]  . Sodium 09/12/2020 138  135 - 145 mmol/L Final  . Potassium 09/12/2020 3.7  3.5 - 5.1 mmol/L Final  . Chloride 09/12/2020 106  98 - 111 mmol/L Final  . CO2 09/12/2020 23  22 - 32 mmol/L Final  . Glucose, Bld 09/12/2020 128* 70 - 99 mg/dL Final   Glucose reference range applies only to samples taken after fasting for at least 8 hours.  . BUN 09/12/2020 5* 6 - 20 mg/dL Final  . Creatinine, Ser 09/12/2020 1.15  0.61 - 1.24 mg/dL Final  . Calcium 09/12/2020 9.2  8.9 - 10.3 mg/dL Final  . Total Protein 09/12/2020 7.7  6.5 - 8.1 g/dL Final  . Albumin 09/12/2020 3.7  3.5 - 5.0 g/dL Final  . AST 09/12/2020 16  15 - 41 U/L Final  . ALT 09/12/2020 12  0 - 44 U/L Final  . Alkaline Phosphatase 09/12/2020 91  38 - 126 U/L Final  . Total Bilirubin 09/12/2020 1.7* 0.3 - 1.2 mg/dL Final  . GFR, Estimated 09/12/2020 >60  >60 mL/min Final   Comment: (NOTE) Calculated using the CKD-EPI Creatinine Equation (2021)   . Anion gap 09/12/2020 9  5 - 15 Final   Performed at [facility], [place] [address] [place], [place] [address]  . B Natriuretic Peptide 09/12/2020 45.5  0.0 - 100.0 pg/mL Final   Performed at [place] [address] [place], [place] [address]  . Heparin Unfractionated 09/12/2020 1.10* 0.30 - 0.70 IU/mL Final   Comment: (NOTE) If  heparin results are below expected values, and patient dosage has  been confirmed, suggest follow up testing of antithrombin III levels. Performed at [facility], [place] [address] [place], [place] [address]   . Heparin Unfractionated 09/12/2020 0.54  0.30 - 0.70 IU/mL Final   Comment: (NOTE) If heparin results are below expected values, and patient dosage has  been confirmed, suggest follow up testing of antithrombin III levels. Performed at [facility], [place] [address] [place], [place] [address]   . Sodium 09/13/2020 135  135 - 145 mmol/L Final  . Potassium 09/13/2020 3.7  3.5 - 5.1 mmol/L Final  . Chloride 09/13/2020 100  98 - 111 mmol/L Final  . CO2 09/13/2020 23  22 -  32 mmol/L Final  . Glucose, Bld 09/13/2020 101* 70 - 99 mg/dL Final   Glucose reference range applies only to samples taken after fasting for at least 8 hours.  . BUN 09/13/2020 7  6 - 20 mg/dL Final  . Creatinine, Ser 09/13/2020 1.16  0.61 - 1.24 mg/dL Final  . Calcium 09/13/2020 8.9  8.9 - 10.3 mg/dL Final  . Total Protein 09/13/2020 7.2  6.5 - 8.1 g/dL Final  . Albumin 09/13/2020 3.5  3.5 - 5.0 g/dL Final  . AST 09/13/2020 15  15 - 41 U/L Final  . ALT 09/13/2020 12  0 - 44 U/L Final  . Alkaline Phosphatase 09/13/2020 78  38 - 126 U/L Final  . Total Bilirubin 09/13/2020 2.1* 0.3 - 1.2 mg/dL Final  . GFR, Estimated 09/13/2020 >60  >60 mL/min Final   Comment: (NOTE) Calculated using the CKD-EPI Creatinine Equation (2021)   . Anion gap 09/13/2020 12  5 - 15 Final   Performed at [facility], [place] [address] [place], [place] [address]  . Heparin Unfractionated 09/13/2020 0.30  0.30 - 0.70 IU/mL Final   Comment: (NOTE) If heparin results are below expected values, and patient dosage has  been confirmed, suggest follow up testing of antithrombin III levels. Performed at [facility], [place] [address] [place], [place] [address]   . WBC  09/13/2020 12.8* 4.0 - 10.5 K/uL Final  . RBC 09/13/2020 5.11  4.22 - 5.81 MIL/uL Final  . Hemoglobin 09/13/2020 15.6  13.0 - 17.0 g/dL Final  . HCT 09/13/2020 46.4  39.0 - 52.0 % Final  . MCV 09/13/2020 90.8  80.0 - 100.0 fL Final  . MCH 09/13/2020 30.5  26.0 - 34.0 pg Final  . MCHC 09/13/2020 33.6  30.0 - 36.0 g/dL Final  . RDW 09/13/2020 13.3  11.5 - 15.5 % Final  . Platelets 09/13/2020 179  150 - 400 K/uL Final  . nRBC 09/13/2020 0.0  0.0 - 0.2 % Final   Performed at [facility], [address] [address] [place], [place] [address]    (this displays the last labs from the last 3 days)  No results found for: TOTALPROTELP, ALBUMINELP, A1GS, A2GS, BETS, BETA2SER, GAMS, MSPIKE, SPEI (this displays SPEP labs)  No results found for: KPAFRELGTCHN, LAMBDASER, KAPLAMBRATIO (kappa/lambda light chains)  No results found for: HGBA, HGBA2QUANT, HGBFQUANT, HGBSQUAN (Hemoglobinopathy evaluation)   No results found for: LDH  No results found for: IRON, TIBC, IRONPCTSAT (Iron and TIBC)  No results found for: FERRITIN  Urinalysis    Component Value Date/Time   COLORURINE AMBER (A) 06/15/2020 [place] 06/15/2020 0318   LABSPEC 1.026 06/15/2020 0318   PHURINE 5.0 06/15/2020 0318   GLUCOSEU NEGATIVE 06/15/2020 0318   HGBUR NEGATIVE 06/15/2020 [place] 06/15/2020 0318   KETONESUR 20 (A) 06/15/2020 0318   PROTEINUR 30 (A) 06/15/2020 0318   NITRITE NEGATIVE 06/15/2020 0318   LEUKOCYTESUR NEGATIVE 06/15/2020 0318     STUDIES: DG Chest 2 View  Result Date: 09/11/2020 CLINICAL DATA:  Chest pain and shortness of breath. EXAM: CHEST - 2 VIEW COMPARISON:  Chest CTA  06/11/2020 FINDINGS: Patchy peripheral opacity at the right lung base, likely localizing to the right middle lobe. Left lung is clear. Overall low lung volumes. Normal heart size and mediastinal contours. No pneumothorax. No large pleural effusion. No acute osseous abnormalities are  seen. IMPRESSION: 1. Patchy right lung base opacity, may represent pneumonia in the  appropriate clinical setting. Recommend correlation for infectious symptoms. 2. Pulmonary infarct could have a similar radiographic appearance. Consider chest CTA based on clinical concern. Electronically Signed   By: Keith Rake M.D.   On: 09/11/2020 23:39   CT Angio Chest PE W and/or Wo Contrast  Result Date: 09/12/2020 CLINICAL DATA:  Right-sided chest pain. Shortness of breath. History of DVT. Pulmonary embolus suspected. EXAM: CT ANGIOGRAPHY CHEST WITH CONTRAST TECHNIQUE: Multidetector CT imaging of the chest was performed using the standard protocol during bolus administration of intravenous contrast. Multiplanar CT image reconstructions and MIPs were obtained to evaluate the vascular anatomy. CONTRAST:  146m OMNIPAQUE IOHEXOL 350 MG/ML SOLN COMPARISON:  Chest x-ray 09/11/2020, CT angio 06/11/2020 FINDINGS: Cardiovascular: Poor opacification of the pulmonary arteries to the segmental level. Central right pulmonary emboli extending to all lobes at the segmental and subsegmental levels. Segmental left lower and upper lobe pulmonary emboli extending to the subsegmental level. The main pulmonary artery is normal in caliber. Normal heart size. No increased right to left ventricular ratio. No straightening or paradoxical bowing of the interventricular septum. No significant pericardial effusion. The thoracic aorta is normal in caliber. No atherosclerotic plaque of the thoracic aorta. No coronary artery calcifications. Mediastinum/Nodes: No enlarged mediastinal, hilar, or axillary lymph nodes. Thyroid gland, trachea, and esophagus demonstrate no significant findings. Lungs/Pleura: There is a peripheral wedge-shaped ground-glass bubbly appearing opacity within the right lower lobe that likely represents a pulmonary infarction. Stable pulmonary micronodule within left lower lobe (5:41). No pleural effusion. No pneumothorax.  Upper Abdomen: No acute abnormality. Musculoskeletal: No chest wall abnormality. No acute or significant osseous findings. Review of the MIP images confirms the above findings. IMPRESSION: 1. Extensive pulmonary emboli bilaterally with associated right lower lobe pulmonary infarction. No definite right heart strain. 2. Central right pulmonary emboli extending to all lobes at the segmental and subsegmental levels. Segmental and subsegmental left lower and upper lobe pulmonary emboli. These results were called by telephone at the time of interpretation on 09/12/2020 at 2:12 am to provider DVeryl Speak, who verbally acknowledged these results. Electronically Signed   By: MIven FinnM.D.   On: 09/12/2020 02:18    ELIGIBLE FOR AVAILABLE RESEARCH PROTOCOL: no  ASSESSMENT: 25y.o. [place] man with a history of multiple unprovoked clots, specifically documented  (a) left lower leg acute deep venous thrombosis on Doppler 02/22/2019  (b) left lower extremity DVT Doppler 06/28/2020  (c) extensive bilateral pulmonary embolism on CT angio chest 09/12/2020  (1) in addition there is a history of recurrent syncope, status post extensive neurologic evaluation without a specific cause or diagnosis established  PLAN: I went over Mr. WLeinohistory in detail with him.  He understands in the absence of any treatment he is likely to continue to have unprovoked clots.  He understands the risk of death from pulmonary emboli untreated is very high.  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  Accordingly he needs a lifelong anticoagulation.  We also discussed the fact that once he is anticoagulated he will be at risk of bleeding.  This is a concern given his history of syncope.  He needs to guard against any trauma to the head in particular.  After our discussion he has a good understanding of this.  I am obtaining a hypercoagulable panel as this may be important to the patient's family but it  does not affect the above recommendation  Given the patient's financial status we will be starting warfarin and I have  placed a pharmacy consult to that end.  We will arrange for the patient to have outpatient monitoring through our facility at the cancer center until he establishes himself with a primary care physician.  This was discussed with the patient and he is in agreement  In addition there is an history of unexplained syncope.  There has been extensive neurologic evaluation previously.  It may be worthwhile to again consult neurology, asked them to review the previously obtained tests, and see if any further evaluation is needed for that problem  I appreciate being consulted on this patient.  We will follow with you  Jared Cruel, MD   09/13/2020 9:05 AM Medical Oncology and Hematology [facility] [address] [place], [place] [address] Tel. [phone]    Fax. [phone]

## 2020-09-13 NOTE — Progress Notes (Addendum)
[facility]  PROGRESS NOTE  Jared Carlson NFA:[id number] DOB: 25-Oct-1995 DOA: 09/11/2020 PCP: Patient, No Pcp Per   Brief HPI:   25 year old male with history of recurrent DVT presented with right-sided chest pain.  Patient has been donating plasma since 2018 and did that few days ago.  Patient says he developed right-sided chest pain, he tried to ignore it for first day.  He was having worsening shortness of breath.  Patient was initially diagnosed with DVT in October 2020 and was discharged on Xarelto.  Patient says that after starter pack was gone he was unable to get refill.  And he had another DVT in December 2021 at that time also he was sent home on Xarelto but again could not get refills on this medication as it was too expensive. Now patient presented to the ED on 09/12/2020 with CTA showing extensive pulmonary emboli bilaterally with associated right lower lobe pulmonary infarction. Patient also had syncope with bilateral lower extremity flaccid paralysis in December 2021 at that time he was seen by neurology.  CTV brain did not show venous sinus thrombosis, MRI lumbar spine only showed mild foraminal stenosis.  EEG on 06/15/2020 was read as normal with no evidence of seizures.  Outpatient EMG/NCS was recommended.    Subjective   Patient seen and examined, continues to have right-sided chest pain.  Started on heparin.  Echocardiogram is still pending.   Assessment/Plan:     1. Acute pulmonary embolism with right lower lobe pulmonary infarct-patient has had multiple episodes of DVTs since August 2020.  He will has been prescribed Xarelto in the past twice however could not get refills as he could not afford it.  Now he is coming with acute pulmonary embolism.  I consulted hematology/oncology, Dr. Darnelle Catalan has seen the patient.  At this time as patient cannot afford taking Xarelto we will start him on Coumadin per pharmacy consultation.  We will continue with heparin for bridging.   Patient will follow up with Dr. Darnelle Catalan in the cancer center to get PT/INR checked till he gets his own PCP.  Echocardiogram is currently pending.  Patient is requiring oxygen 3 L/min.  Hypercoagulability work-up has been ordered per hematology/oncology.  Patient will need lifelong anticoagulation.  2. Right-sided chest pain-patient has likely pleuritic chest pain from right lower lobe pulmonary infarction.  Will give 1 dose of IV Toradol 30 mg x 1.  Continue morphine as needed.  3. Syncope-patient was seen by neurology in December, patient had work-up for syncope with lower extremity flaccid paralysis at that time CT brain did not show venous sinus thrombosis.  MRI lumbar spine only showed mild foraminal stenosis.  Curious constellation of symptoms and exam which was significantly out of proportion to imaging findings.  He also had EEG which showed no evidence of seizures.  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  I called and discussed with neurologist on-call Dr. Iver Nestle, she reviewed the notes from neurology consultation in December 2021.  She does not feel any further investigations are needed at this time.  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  We will continue to monitor.    COVID-19 Labs  No results for input(s): DDIMER, FERRITIN, LDH, CRP in the last 72 hours.  Lab Results  Component Value Date   SARSCOV2NAA NEGATIVE 09/12/2020   SARSCOV2NAA NEGATIVE 06/15/2020     Scheduled medications:   . coumadin book   Does not apply  Once  . docusate sodium  100 mg Oral BID  . warfarin  10 mg Oral ONCE-1600  . warfarin   Does not apply Once  . Warfarin - Pharmacist Dosing Inpatient   Does not apply q1600       Data Reviewed:   CBG: No results for input(s): GLUCAP in the last 168 hours.  SpO2: 100 % O2 Flow Rate (L/min): 3 L/min      CBC:  Recent Labs  Lab  09/11/20 2335 09/12/20 1115 09/13/20 0521  WBC 13.2* 14.2* 12.8*  HGB 16.7 16.1 15.6  HCT 48.4 47.4 46.4  PLT 192 191 179  MCV 88.5 89.4 90.8  MCH 30.5 30.4 30.5  MCHC 34.5 34.0 33.6  RDW 13.3 13.4 13.3  LYMPHSABS  --  2.1  --   MONOABS  --  1.5*  --   EOSABS  --  0.0  --   BASOSABS  --  0.0  --     Complete metabolic panel:  Recent Labs  Lab 09/11/20 2335 09/12/20 1115 09/13/20 0521  NA 136 138 135  K 3.9 3.7 3.7  CL 105 106 100  CO2 19* 23 23  GLUCOSE 140* 128* 101*  BUN 7 5* 7  CREATININE 1.05 1.15 1.16  CALCIUM 9.1 9.2 8.9  AST  --  16 15  ALT  --  12 12  ALKPHOS  --  91 78  BILITOT  --  1.7* 2.1*  ALBUMIN  --  3.7 3.5  BNP  --  45.5  --     No results for input(s): LIPASE, AMYLASE in the last 168 hours.  Recent Labs  Lab 09/12/20 0250 09/12/20 1115  BNP  --  45.5  SARSCOV2NAA NEGATIVE  --     ------------------------------------------------------------------------------------------------------------------ No results for input(s): CHOL, HDL, LDLCALC, TRIG, CHOLHDL, LDLDIRECT in the last 72 hours.  Lab Results  Component Value Date   HGBA1C 5.0 06/15/2020   ------------------------------------------------------------------------------------------------------------------ No results for input(s): TSH, T4TOTAL, T3FREE, THYROIDAB in the last 72 hours.  Invalid input(s): FREET3 ------------------------------------------------------------------------------------------------------------------ No results for input(s): VITAMINB12, FOLATE, FERRITIN, TIBC, IRON, RETICCTPCT in the last 72 hours.  Coagulation profile  No results for input(s): INR, PROTIME in the last 168 hours.  No results for input(s): DDIMER in the last 72 hours.  Cardiac Enzymes  No results for input(s): CKMB, TROPONINI, MYOGLOBIN in the last 168 hours.  Invalid input(s):  CK ------------------------------------------------------------------------------------------------------------------    Component Value Date/Time   BNP 45.5 09/12/2020 1115     Radiology Reports  DG Chest 2 View  Result Date: 09/11/2020 CLINICAL DATA:  Chest pain and shortness of breath. EXAM: CHEST - 2 VIEW COMPARISON:  Chest CTA  06/11/2020 FINDINGS: Patchy peripheral opacity at the right lung base, likely localizing to the right middle lobe. Left lung is clear. Overall low lung volumes. Normal heart size and mediastinal contours. No pneumothorax. No large pleural effusion. No acute osseous abnormalities are seen. IMPRESSION: 1. Patchy right lung base opacity, may represent pneumonia in the appropriate clinical setting. Recommend correlation for infectious symptoms. 2. Pulmonary infarct could have a similar radiographic appearance. Consider chest CTA based on clinical concern. Electronically Signed   By: Narda RutherfordMelanie  Sanford M.D.   On: 09/11/2020 23:39   CT Angio Chest PE W and/or Wo Contrast  Result Date: 09/12/2020 CLINICAL DATA:  Right-sided chest pain. Shortness of breath. History of DVT. Pulmonary embolus suspected. EXAM: CT ANGIOGRAPHY CHEST WITH CONTRAST TECHNIQUE: Multidetector CT imaging of the chest was performed using  the standard protocol during bolus administration of intravenous contrast. Multiplanar CT image reconstructions and MIPs were obtained to evaluate the vascular anatomy. CONTRAST:  OMNIPAQUE IOHEXOL 350 MG/ML SOLN COMPARISON:  Chest x-ray 09/11/2020, CT angio 06/11/2020 FINDINGS: Cardiovascular: Poor opacification of the pulmonary arteries to the segmental level. Central right pulmonary emboli extending to all lobes at the segmental and subsegmental levels. Segmental left lower and upper lobe pulmonary emboli extending to the subsegmental level. The main pulmonary artery is normal in caliber. Normal heart size. No increased right to left ventricular ratio. No  straightening or paradoxical bowing of the interventricular septum. No significant pericardial effusion. The thoracic aorta is normal in caliber. No atherosclerotic plaque of the thoracic aorta. No coronary artery calcifications. Mediastinum/Nodes: No enlarged mediastinal, hilar, or axillary lymph nodes. Thyroid gland, trachea, and esophagus demonstrate no significant findings. Lungs/Pleura: There is a peripheral wedge-shaped ground-glass bubbly appearing opacity within the right lower lobe that likely represents a pulmonary infarction. Stable pulmonary micronodule within left lower lobe (5:41). No pleural effusion. No pneumothorax. Upper Abdomen: No acute abnormality. Musculoskeletal: No chest wall abnormality. No acute or significant osseous findings. Review of the MIP images confirms the above findings. IMPRESSION: 1. Extensive pulmonary emboli bilaterally with associated right lower lobe pulmonary infarction. No definite right heart strain. 2. Central right pulmonary emboli extending to all lobes at the segmental and subsegmental levels. Segmental and subsegmental left lower and upper lobe pulmonary emboli. These results were called by telephone at the time of interpretation on 09/12/2020 at 2:12 am to provider Geoffery Lyons , who verbally acknowledged these results. Electronically Signed   By: Tish Frederickson M.D.   On: 09/12/2020 02:18      Antibiotics: Anti-infectives (From admission, onward)   None       DVT prophylaxis: Heparin  Code Status: Full code  Family Communication: No family at bedside   Consultants:    Procedures:      Objective   Vitals:   09/12/20 1814 09/12/20 2157 09/13/20 0457 09/13/20 1012  BP: 131/86 119/63 (!) 152/77 (!) 154/75  Pulse: 87 99 83 95  Resp: (!) 33  20 (!) 22  Temp: 99.5 F (37.5 C) (!) 100.4 F (38 C) 99.4 F (37.4 C) 99.6 F (37.6 C)  TempSrc: Oral Oral Oral Oral  SpO2: 99% 100% 100% 100%  Weight:      Height:        Intake/Output  Summary (Last 24 hours) at 09/13/2020 1149 Last data filed at 09/12/2020 1502 Kaizer Dissinger per 24 hour  Intake 15.39 ml  Output -  Net 15.39 ml    03/17 1901 - 03/19 0700 In: 15.4 [I.V.:15.4] Out: -   Filed Weights   09/11/20 2315  Weight: 113.4 kg    Physical Examination:    General-appears in no acute distress  Heart-S1-S2, regular, no murmur auscultated  Lungs-clear to auscultation bilaterally, no wheezing or crackles auscultated  Abdomen-soft, nontender, no organomegaly  Extremities-no edema in the lower extremities  Neuro-alert, oriented x3, no focal deficit noted   Status is: Inpatient  Dispo: The patient is from: Home              Anticipated d/c is to: Home              Anticipated d/c date is: 09/16/2020              Patient currently not medically stable for discharge  Barrier to discharge-ongoing management for pulmonary embolism  Microbiology  Recent Results (  from the past 240 hour(s))  Resp Panel by RT-PCR (Flu A&B, Covid) Nasopharyngeal Swab     Status: None   Collection Time: 09/12/20  2:50 AM   Specimen: Nasopharyngeal Swab; Nasopharyngeal(NP) swabs in vial transport medium  Result Value Ref Range Status   SARS Coronavirus 2 by RT PCR NEGATIVE NEGATIVE Final    Comment: (NOTE) SARS-CoV-2 target nucleic acids are NOT DETECTED.        Influenza A by PCR NEGATIVE NEGATIVE Final   Influenza B by PCR NEGATIVE NEGATIVE Final    Comment:          Meredeth Ide   [facility] If 7PM-7AM, please contact night-coverage at [email or web address], Office  [phone]   09/13/2020, 11:49 AM  LOS: 1 day

## 2020-09-14 LAB — CBC
HCT: 46 % (ref 39.0–52.0)
Hemoglobin: 15.3 g/dL (ref 13.0–17.0)
MCH: 29.9 pg (ref 26.0–34.0)
MCHC: 33.3 g/dL (ref 30.0–36.0)
MCV: 90 fL (ref 80.0–100.0)
Platelets: 180 10*3/uL (ref 150–400)
RBC: 5.11 MIL/uL (ref 4.22–5.81)
RDW: 13.2 % (ref 11.5–15.5)
WBC: 11.9 10*3/uL — ABNORMAL HIGH (ref 4.0–10.5)
nRBC: 0 % (ref 0.0–0.2)

## 2020-09-14 LAB — PROTIME-INR
INR: 1.3 — ABNORMAL HIGH (ref 0.8–1.2)
Prothrombin Time: 15.3 seconds — ABNORMAL HIGH (ref 11.4–15.2)

## 2020-09-14 LAB — CARDIOLIPIN ANTIBODIES, IGG, IGM, IGA
Anticardiolipin IgA: <9 APL U/mL (ref 0–11)
Anticardiolipin IgG: <9 GPL U/mL (ref 0–14)
Anticardiolipin IgM: <9 MPL U/mL (ref 0–12)

## 2020-09-14 LAB — HEPARIN LEVEL (UNFRACTIONATED): Heparin Unfractionated: 0.34 IU/mL (ref 0.30–0.70)

## 2020-09-14 MED ORDER — WARFARIN SODIUM 5 MG PO TABS
10.0000 mg | ORAL_TABLET | Freq: Once | ORAL | Status: AC
  Administered 2020-09-14: 10 mg via ORAL
  Filled 2020-09-14: qty 2
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
## 2020-09-14 NOTE — Progress Notes (Signed)
Again  Pt stating that he want to leave. Discussed again the possible outcomes if he leaves against medical advice. Will notify Dr. Sharl Ma and wait his input. Pt is agreeable to staying until he gets his dose of coumadin

## 2020-09-14 NOTE — Progress Notes (Signed)
[facility]  PROGRESS NOTE  Jared Carlson ZOX:[id number] DOB: Nov 10, 1995 DOA: 09/11/2020 PCP: Patient, No Pcp Per   Brief HPI:   25 year old male with history of recurrent DVT presented with right-sided chest pain.  Patient has been donating plasma since 2018 and did that few days ago.  Patient says he developed right-sided chest pain, he tried to ignore it for first day.  He was having worsening shortness of breath.  Patient was initially diagnosed with DVT in October 2020 and was discharged on Xarelto.  Patient says that after starter pack was gone he was unable to get refill.  And he had another DVT in December 2021 at that time also he was sent home on Xarelto but again could not get refills on this medication as it was too expensive. Now patient presented to the ED on 09/12/2020 with CTA showing extensive pulmonary emboli bilaterally with associated right lower lobe pulmonary infarction. Patient also had syncope with bilateral lower extremity flaccid paralysis in December 2021 at that time he was seen by neurology.  CTV brain did not show venous sinus thrombosis, MRI lumbar spine only showed mild foraminal stenosis.  EEG on 06/15/2020 was read as normal with no evidence of seizures.  Outpatient EMG/NCS was recommended.    Subjective   Patient seen and examined, wanted to leave AMA this morning.  However decided not to leave after current discussed with patient and his mother.   Assessment/Plan:     1. Acute pulmonary embolism with right lower lobe pulmonary infarct-patient has had multiple episodes of DVTs since August 2020.  He will has been prescribed Xarelto in the past twice however could not get refills as he could not afford it.  Now he is coming with acute pulmonary embolism.  Hematology/oncology was consulted.  Dr. Darnelle CatalanMagrinat has seen the patient.  At this time as patient cannot afford taking Xarelto we will start him on Coumadin per pharmacy consultation.  We will continue with  heparin for bridging.  Patient will follow up with Dr. Darnelle CatalanMagrinat in the cancer center to get PT/INR checked till he gets his own PCP.  Echocardiogram shows normal right ventricular pressure.  PA pressure could not be regulated due to tricuspid regurgitation.  Patient is requiring oxygen 3 L/min.  Hypercoagulability work-up has been ordered per hematology/oncology.  Patient will need lifelong anticoagulation.  2. Right-sided chest pain-resolved after patient was given IV Toradol.  Likely from pleuritic chest pain from right lower lobe pulmonary infarction.    3. Syncope-patient was seen by neurology in December, patient had work-up for syncope with lower extremity flaccid paralysis at that time CT brain did not show venous sinus thrombosis.  MRI lumbar spine only showed mild foraminal stenosis.  Curious constellation of symptoms and exam which was significantly out of proportion to imaging findings.  He also had EEG which showed no evidence of seizures.  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  I called and discussed with neurologist on-call Dr. Iver NestleBhagat, she reviewed the notes from neurology consultation in December 2021.  She does not feel any further investigations are needed at this time.  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  We will continue to monitor. 4. Discussed in detail with patient the importance of staying in the hospital with bridging of heparin with Coumadin.  I told him that if he leaves AMA, he will die as he will not be having adequate anticoagulation to prevent  further PEs.  At this time patient has decided to stay in the hospital.    COVID-19 Labs  No results for input(s): DDIMER, FERRITIN, LDH, CRP in the last 72 hours.  Lab Results  Component Value Date   SARSCOV2NAA NEGATIVE 09/12/2020   SARSCOV2NAA NEGATIVE 06/15/2020     Scheduled medications:   . docusate  sodium  100 mg Oral BID  . warfarin  10 mg Oral ONCE-1600  . warfarin   Does not apply Once  . Warfarin - Pharmacist Dosing Inpatient   Does not apply q1600       Data Reviewed:   CBG: No results for input(s): GLUCAP in the last 168 hours.  SpO2: 98 % O2 Flow Rate (L/min): 3 L/min      CBC:  Recent Labs  Lab 09/11/20 2335 09/12/20 1115 09/13/20 0521 09/14/20 0044  WBC 13.2* 14.2* 12.8* 11.9*  HGB 16.7 16.1 15.6 15.3  HCT 48.4 47.4 46.4 46.0  PLT 192 191 179 180  MCV 88.5 89.4 90.8 90.0  MCH 30.5 30.4 30.5 29.9  MCHC 34.5 34.0 33.6 33.3  RDW 13.3 13.4 13.3 13.2  LYMPHSABS  --  2.1  --   --   MONOABS  --  1.5*  --   --   EOSABS  --  0.0  --   --   BASOSABS  --  0.0  --   --     Complete metabolic panel:  Recent Labs  Lab 09/11/20 2335 09/12/20 1115 09/13/20 0521 09/13/20 1151 09/14/20 0044  NA 136 138 135  --   --   K 3.9 3.7 3.7  --   --   CL 105 106 100  --   --   CO2 19* 23 23  --   --   GLUCOSE 140* 128* 101*  --   --   BUN 7 5* 7  --   --   CREATININE 1.05 1.15 1.16  --   --   CALCIUM 9.1 9.2 8.9  --   --   AST  --  16 15  --   --   ALT  --  12 12  --   --   ALKPHOS  --  91 78  --   --   BILITOT  --  1.7* 2.1*  --   --   ALBUMIN  --  3.7 3.5  --   --   INR  --   --   --  1.3* 1.3*  BNP  --  45.5  --   --   --     No results for input(s): LIPASE, AMYLASE in the last 168 hours.  Recent Labs  Lab 09/12/20 0250 09/12/20 1115  BNP  --  45.5  SARSCOV2NAA NEGATIVE  --     ------------------------------------------------------------------------------------------------------------------ No results for input(s): CHOL, HDL, LDLCALC, TRIG, CHOLHDL, LDLDIRECT in the last 72 hours.  Lab Results  Component Value Date   HGBA1C 5.0 06/15/2020   ------------------------------------------------------------------------------------------------------------------ No results for input(s): TSH, T4TOTAL, T3FREE, THYROIDAB in the last 72 hours.  Invalid  input(s): FREET3 ------------------------------------------------------------------------------------------------------------------ No results for input(s): VITAMINB12, FOLATE, FERRITIN, TIBC, IRON, RETICCTPCT in the last 72 hours.  Coagulation profile  Recent Labs  Lab 09/13/20 1151 09/14/20 0044  INR 1.3* 1.3*    No results for input(s): DDIMER in the last 72 hours.  Cardiac Enzymes  No results for input(s): CKMB, TROPONINI, MYOGLOBIN in the last 168 hours.  Invalid input(s): CK ------------------------------------------------------------------------------------------------------------------  Component Value Date/Time   BNP 45.5 09/12/2020 1115     Radiology Reports  DG Chest 2 View  Result Date: 09/11/2020 CLINICAL DATA:  Chest pain and shortness of breath. EXAM: CHEST - 2 VIEW COMPARISON:  Chest CTA  06/11/2020 FINDINGS: Patchy peripheral opacity at the right lung base, likely localizing to the right middle lobe. Left lung is clear. Overall low lung volumes. Normal heart size and mediastinal contours. No pneumothorax. No large pleural effusion. No acute osseous abnormalities are seen. IMPRESSION: 1. Patchy right lung base opacity, may represent pneumonia in the appropriate clinical setting. Recommend correlation for infectious symptoms. 2. Pulmonary infarct could have a similar radiographic appearance. Consider chest CTA based on clinical concern. Electronically Signed   By: Narda Rutherford M.D.   On: 09/11/2020 23:39   CT Angio Chest PE W and/or Wo Contrast  Result Date: 09/12/2020 CLINICAL DATA:  Right-sided chest pain. Shortness of breath. History of DVT. Pulmonary embolus suspected. EXAM: CT ANGIOGRAPHY CHEST WITH CONTRAST TECHNIQUE: Multidetector CT imaging of the chest was performed using the standard protocol during bolus administration of intravenous contrast. Multiplanar CT image reconstructions and MIPs were obtained to evaluate the vascular anatomy. CONTRAST:   OMNIPAQUE IOHEXOL 350 MG/ML SOLN COMPARISON:  Chest x-ray 09/11/2020, CT angio 06/11/2020 FINDINGS: Cardiovascular: Poor opacification of the pulmonary arteries to the segmental level. Central right pulmonary emboli extending to all lobes at the segmental and subsegmental levels. Segmental left lower and upper lobe pulmonary emboli extending to the subsegmental level. The main pulmonary artery is normal in caliber. Normal heart size. No increased right to left ventricular ratio. No straightening or paradoxical bowing of the interventricular septum. No significant pericardial effusion. The thoracic aorta is normal in caliber. No atherosclerotic plaque of the thoracic aorta. No coronary artery calcifications. Mediastinum/Nodes: No enlarged mediastinal, hilar, or axillary lymph nodes. Thyroid gland, trachea, and esophagus demonstrate no significant findings. Lungs/Pleura: There is a peripheral wedge-shaped ground-glass bubbly appearing opacity within the right lower lobe that likely represents a pulmonary infarction. Stable pulmonary micronodule within left lower lobe (5:41). No pleural effusion. No pneumothorax. Upper Abdomen: No acute abnormality. Musculoskeletal: No chest wall abnormality. No acute or significant osseous findings. Review of the MIP images confirms the above findings. IMPRESSION: 1. Extensive pulmonary emboli bilaterally with associated right lower lobe pulmonary infarction. No definite right heart strain. 2. Central right pulmonary emboli extending to all lobes at the segmental and subsegmental levels. Segmental and subsegmental left lower and upper lobe pulmonary emboli. These results were called by telephone at the time of interpretation on 09/12/2020 at 2:12 am to provider Geoffery Lyons , who verbally acknowledged these results. Electronically Signed   By: Tish Frederickson M.D.   On: 09/12/2020 02:18      Antibiotics: Anti-infectives (From admission, onward)   None       DVT  prophylaxis: Heparin  Code Status: Full code  Family Communication: No family at bedside   Consultants:  Oncology  Procedures:      Objective   Vitals:   09/13/20 1449 09/13/20 1900 09/14/20 0400 09/14/20 1100  BP: 139/77 (!) 142/75 (!) 117/58   Pulse: 97 79 87   Resp: 16  (!) 22   Temp: 99.2 F (37.3 C) 99.9 F (37.7 C) 98.9 F (37.2 C)   TempSrc: Oral Oral Oral   SpO2: 97% 93% 98%   Weight:    110.3 kg  Height:    5\' 9"  (1.753 m)    Intake/Output Summary (Last 24  hours) at 09/14/2020 1300 Last data filed at 09/13/2020 2100 Gross per 24 hour  Intake 126.58 ml  Output --  Net 126.58 ml    03/18 1901 - 03/20 0700 In: 246.6 [P.O.:120; I.V.:126.6] Out: -   Filed Weights   09/11/20 2315 09/14/20 1100  Weight: 113.4 kg 110.3 kg    Physical Examination:   General-appears in no acute distress  Heart-S1-S2, regular, no murmur auscultated  Lungs-clear to auscultation bilaterally, no wheezing or crackles auscultated  Abdomen-soft, nontender, no organomegaly  Extremities-no edema in the lower extremities  Neuro-alert, oriented x3, no focal deficit noted   Status is: Inpatient  Dispo: The patient is from: Home              Anticipated d/c is to: Home              Anticipated d/c date is: 09/16/2020              Patient currently not medically stable for discharge  Barrier to discharge-ongoing management for pulmonary embolism  Microbiology  Recent Results (from the past 240 hour(s))  Resp Panel by RT-PCR (Flu A&B, Covid) Nasopharyngeal Swab     Status: None   Collection Time: 09/12/20  2:50 AM   Specimen: Nasopharyngeal Swab; Nasopharyngeal(NP) swabs in vial transport medium  Result Value Ref Range Status   SARS Coronavirus 2 by RT PCR NEGATIVE NEGATIVE Final    Comment: (NOTE) SARS-CoV-2 target nucleic acids are NOT DETECTED.        Influenza A by PCR NEGATIVE NEGATIVE Final   Influenza B by PCR NEGATIVE NEGATIVE Final    Comment:           Meredeth Ide   [facility] If 7PM-7AM, please contact night-coverage at [email or web address], Office  [phone]   09/14/2020, 1:00 PM  LOS: 2 days

## 2020-09-14 NOTE — Progress Notes (Signed)
Jared Carlson   DOB:07/01/1995   TK#:[id number]   DJM#:[id number]  Subjective:  Jared Carlson feels better and breathing is better; when showering his HR went up (per RN( but he was not aware of it; he still has pain occ in right chest (lung) area; no cough, phlegm, hemoptysis, no bleeding or bruising. He knows his INR results today and knows the therapeutic range (good teaching from pharmacy and staff!); SO Arielle and dog in room   Objective: African American man examined in bed Vitals:   09/13/20 1900 09/14/20 0400  BP: (!) 142/75 (!) 117/58  Pulse: 79 87  Resp:  (!) 22  Temp: 99.9 F (37.7 C) 98.9 F (37.2 C)  SpO2: 93% 98%    Body mass index is 36.92 kg/m.  Intake/Output Summary (Last 24 hours) at 09/14/2020 1018 Last data filed at 09/13/2020 2100 Gross per 24 hour  Intake 246.58 ml  Output --  Net 246.58 ml    Lungs no rales or wheezes--auscultated anterolaterally  Heart regular rate and rhythm  Abdomen soft, +BS  Neuro nonfocal    CBG (last 3)  No results for input(s): GLUCAP in the last 72 hours.   Labs:  Lab Results  Component Value Date   WBC 11.9 (H) 09/14/2020   HGB 15.3 09/14/2020   HCT 46.0 09/14/2020   MCV 90.0 09/14/2020   PLT 180 09/14/2020   NEUTROABS 10.6 (H) 09/12/2020    @LASTCHEMISTRY @  Urine Studies No results for input(s): UHGB, CRYS in the last 72 hours.  Invalid input(s): UACOL, UAPR, USPG, UPH, UTP, UGL, UKET, UBIL, UNIT, UROB, ULEU, UEPI, UWBC, URBC, UBAC, CAST, [place], [place]  Basic Metabolic Panel: Recent Labs  Lab 09/11/20 2335 09/12/20 1115 09/13/20 0521  NA 136 138 135  K 3.9 3.7 3.7  CL 105 106 100  CO2 19* 23 23  GLUCOSE 140* 128* 101*  BUN 7 5* 7  CREATININE 1.05 1.15 1.16  CALCIUM 9.1 9.2 8.9   GFR Estimated Creatinine Clearance: 123 mL/min (by C-G formula based on SCr of 1.16 mg/dL). Liver Function Tests: Recent Labs  Lab 09/12/20 1115 09/13/20 0521  AST 16 15  ALT 12 12  ALKPHOS 91 78  BILITOT 1.7* 2.1*  PROT  7.7 7.2  ALBUMIN 3.7 3.5   No results for input(s): LIPASE, AMYLASE in the last 168 hours. No results for input(s): AMMONIA in the last 168 hours. Coagulation profile Recent Labs  Lab 09/13/20 1151 09/14/20 0044  INR 1.3* 1.3*    CBC: Recent Labs  Lab 09/11/20 2335 09/12/20 1115 09/13/20 0521 09/14/20 0044  WBC 13.2* 14.2* 12.8* 11.9*  NEUTROABS  --  10.6*  --   --   HGB 16.7 16.1 15.6 15.3  HCT 48.4 47.4 46.4 46.0  MCV 88.5 89.4 90.8 90.0  PLT 192 191 179 180   Cardiac Enzymes: No results for input(s): CKTOTAL, CKMB, CKMBINDEX, TROPONINI in the last 168 hours. BNP: Invalid input(s): POCBNP CBG: No results for input(s): GLUCAP in the last 168 hours. D-Dimer No results for input(s): DDIMER in the last 72 hours. Hgb A1c No results for input(s): HGBA1C in the last 72 hours. Lipid Profile No results for input(s): CHOL, HDL, LDLCALC, TRIG, CHOLHDL, LDLDIRECT in the last 72 hours. Thyroid function studies No results for input(s): TSH, T4TOTAL, T3FREE, THYROIDAB in the last 72 hours.  Invalid input(s): FREET3 Anemia work up No results for input(s): VITAMINB12, FOLATE, FERRITIN, TIBC, IRON, RETICCTPCT in the last 72 hours. Microbiology Recent Results (from the past 240  hour(s))  Resp Panel by RT-PCR (Flu A&B, Covid) Nasopharyngeal Swab     Status: None   Collection Time: 09/12/20  2:50 AM   Specimen: Nasopharyngeal Swab; Nasopharyngeal(NP) swabs in vial transport medium  Result Value Ref Range Status   SARS Coronavirus 2 by RT PCR NEGATIVE NEGATIVE Final    Comment: (NOTE) SARS-CoV-2 target nucleic acids are NOT DETECTED.  The SARS-CoV-2 RNA is generally detectable in upper respiratory specimens during the acute phase of infection. The lowest concentration of SARS-CoV-2 viral copies this assay can detect is 138 copies/mL. A negative result does not preclude SARS-Cov-2 infection and should not be used as the sole basis for treatment or other patient management  decisions. A negative result may occur with  improper specimen collection/handling, submission of specimen other than nasopharyngeal swab, presence of viral mutation(s) within the areas targeted by this assay, and inadequate number of viral copies(<138 copies/mL). A negative result must be combined with clinical observations, patient history, and epidemiological information. The expected result is Negative.  Fact Sheet for Patients:  BloggerCourse.comhttps://www.fda.gov/media/152166/download  Fact Sheet for Healthcare Providers:  SeriousBroker.ithttps://www.fda.gov/media/152162/download  This test is no t yet approved or cleared by the [place] FDA and  has been authorized for detection and/or diagnosis of SARS-CoV-2 by FDA under an Emergency Use Authorization (EUA). This EUA will remain  in effect (meaning this test can be used) for the duration of the COVID-19 declaration under Section 564(b)(1) of the Act, 21 U.S.C.section 360bbb-3(b)(1), unless the authorization is terminated  or revoked sooner.       Influenza A by PCR NEGATIVE NEGATIVE Final   Influenza B by PCR NEGATIVE NEGATIVE Final    Comment: (NOTE) The Xpert Xpress SARS-CoV-2/FLU/RSV plus assay is intended as an aid in the diagnosis of influenza from Nasopharyngeal swab specimens and should not be used as a sole basis for treatment. Nasal washings and aspirates are unacceptable for Xpert Xpress SARS-CoV-2/FLU/RSV testing.  Fact Sheet for Patients: BloggerCourse.comhttps://www.fda.gov/media/152166/download  Fact Sheet for Healthcare Providers: SeriousBroker.ithttps://www.fda.gov/media/152162/download  This test is not yet approved or cleared by the [place] FDA and has been authorized for detection and/or diagnosis of SARS-CoV-2 by FDA under an Emergency Use Authorization (EUA). This EUA will remain in effect (meaning this test can be used) for the duration of the COVID-19 declaration under Section 564(b)(1) of the Act, 21 U.S.C. section 360bbb-3(b)(1), unless the  authorization is terminated or revoked.  Performed at [facility], [address] [place], [place] [id number]       Studies:  ECHOCARDIOGRAM COMPLETE  Result Date: 09/13/2020    ECHOCARDIOGRAM REPORT   Patient Name:   Jared MusicGABRIEL Carlson Date of Exam: 09/13/2020 Medical Rec #:  [id number]      Height:       69.0 in Accession #:    [id number]     Weight:       250.0 lb Date of Birth:  [birth year]      BSA:          2.272 m Patient Age:    25 years       BP:           152/77 mmHg Patient Gender: M              HR:           81 bpm. Exam Location:  Inpatient Procedure: 2D Echo, Cardiac Doppler and Color Doppler Indications:    I26.02 Pulmonary embolus  History:  Patient has no prior history of Echocardiogram examinations.                 DVT.  Sonographer:    Tiffany Dance Referring Phys: [id number] Teddy Spike IMPRESSIONS  1. Left ventricular ejection fraction, by estimation, is 55 to 60%. The left ventricle has normal function. The left ventricle has no regional wall motion abnormalities. There is mild concentric left ventricular hypertrophy. Left ventricular diastolic parameters are indeterminate.  2. Right ventricular systolic function is normal. The right ventricular size is normal. Tricuspid regurgitation signal is inadequate for assessing PA pressure.  3. The mitral valve is normal in structure. No evidence of mitral valve regurgitation. No evidence of mitral stenosis.  4. The aortic valve is tricuspid. Aortic valve regurgitation is not visualized. Mild to moderate aortic valve sclerosis/calcification is present, without any evidence of aortic stenosis.  5. The inferior vena cava is normal in size with greater than 50% respiratory variability, suggesting right atrial pressure of 3 mmHg. FINDINGS  Left Ventricle: Left ventricular ejection fraction, by estimation, is 55 to 60%. The left ventricle has normal function. The left ventricle has no regional wall motion abnormalities. The left  ventricular internal cavity size was normal in size. There is  mild concentric left ventricular hypertrophy. Left ventricular diastolic parameters are indeterminate. Normal left ventricular filling pressure. Right Ventricle: The right ventricular size is normal. No increase in right ventricular wall thickness. Right ventricular systolic function is normal. Tricuspid regurgitation signal is inadequate for assessing PA pressure. Left Atrium: Left atrial size was normal in size. Right Atrium: Right atrial size was normal in size. Pericardium: There is no evidence of pericardial effusion. Mitral Valve: The mitral valve is normal in structure. No evidence of mitral valve regurgitation. No evidence of mitral valve stenosis. Tricuspid Valve: The tricuspid valve is normal in structure. Tricuspid valve regurgitation is trivial. No evidence of tricuspid stenosis. Aortic Valve: The aortic valve is tricuspid. Aortic valve regurgitation is not visualized. Mild to moderate aortic valve sclerosis/calcification is present, without any evidence of aortic stenosis. Pulmonic Valve: The pulmonic valve was normal in structure. Pulmonic valve regurgitation is not visualized. No evidence of pulmonic stenosis. Aorta: The aortic root is normal in size and structure. Venous: The inferior vena cava is normal in size with greater than 50% respiratory variability, suggesting right atrial pressure of 3 mmHg. IAS/Shunts: No atrial level shunt detected by color flow Doppler.  LEFT VENTRICLE PLAX 2D LVIDd:         4.30 cm  Diastology LVIDs:         3.00 cm  LV e' medial:    6.20 cm/s LV PW:         1.30 cm  LV E/e' medial:  9.3 LV IVS:        1.20 cm  LV e' lateral:   11.50 cm/s LVOT diam:     2.40 cm  LV E/e' lateral: 5.0 LV SV:         52 LV SV Index:   23 LVOT Area:     4.52 cm  RIGHT VENTRICLE             IVC RV Basal diam:  2.70 cm     IVC diam: 1.20 cm RV S prime:     12.00 cm/s TAPSE (M-mode): 2.0 cm LEFT ATRIUM             Index        RIGHT ATRIUM  Index LA diam:        3.10 cm 1.36 cm/m  RA Area:     9.74 cm LA Vol (A2C):   36.1 ml 15.89 ml/m RA Volume:   20.00 ml 8.80 ml/m LA Vol (A4C):   26.1 ml 11.49 ml/m LA Biplane Vol: 31.8 ml 14.00 ml/m  AORTIC VALVE LVOT Vmax:   67.90 cm/s LVOT Vmean:  50.600 cm/s LVOT VTI:    0.115 m  AORTA Ao Root diam: 2.90 cm Ao Asc diam:  2.70 cm MITRAL VALVE MV Area (PHT): 2.95 cm    SHUNTS MV Decel Time: 257 msec    Systemic VTI:  0.12 m MV E velocity: 57.40 cm/s  Systemic Diam: 2.40 cm MV A velocity: 37.30 cm/s MV E/A ratio:  1.54 Armanda Magic MD Electronically signed by Armanda Magic MD Signature Date/Time: 09/13/2020/1:57:23 PM    Final     Assessment: 25 y.o. Jared Carlson man with a history of multiple unprovoked clots, specifically documented             (a) left lower leg acute deep venous thrombosis on Doppler 02/22/2019             (b) left lower extremity DVT Doppler 06/28/2020             (c) extensive bilateral pulmonary embolism on CT angio chest 09/12/2020  (d) hypercoagulable panel 09/13/2020 results pending  (1) in addition there is a history of recurrent syncope, status post extensive neurologic evaluation without a specific cause or diagnosis established, possibly psychogenic/functional (see neurology notes from DEC 2021 admission and curbside with neurology per Dr Sharl Ma documented in his note 09/13/2020)  (2) anticoagulation:   (a) heparin started 09/12/2020; coumadin added 09/13/2020    Plan:  Jared Carlson is on course for anticoagulation and once his INR  Has been therapeutic 24 hrs could be d/c'd +/- lovenox depending on how much overlap with IV heparin has occurred by then.  I will set up [facility] for f/u labs with [place] late this week or early next week depending on his discharge date.   Will follow with you    Lowella Dell, MD 09/14/2020  10:18 AM Medical Oncology and Hematology [facility] [address] [place], [place] [address] Tel.  [phone]    Fax. [phone]

## 2020-09-14 NOTE — Progress Notes (Addendum)
ANTICOAGULATION CONSULT NOTE - Follow Up Consult  Pharmacy Consult for Heparin + Warfarin  Indication: pulmonary embolus  No Known Allergies  Patient Measurements: Height: 5\' 9"  (175.3 cm) Weight: 113.4 kg (250 lb) IBW/kg (Calculated) : 70.7 Heparin Dosing Weight: 96 kg  Vital Signs: Temp: 99.9 F (37.7 C) (03/19 1900) Temp Source: Oral (03/19 1900) BP: 142/75 (03/19 1900) Pulse Rate: 79 (03/19 1900)  Labs: Recent Labs    09/11/20 2335 09/12/20 0131 09/12/20 0900 09/12/20 1115 09/12/20 2005 09/13/20 0521 09/13/20 1151 09/13/20 1848 09/14/20 0044  HGB 16.7  --   --  16.1  --  15.6  --   --  15.3  HCT 48.4  --   --  47.4  --  46.4  --   --  46.0  PLT 192  --   --  191  --  179  --   --  180  LABPROT  --   --   --   --   --   --  15.2  --  15.3*  INR  --   --   --   --   --   --  1.3*  --  1.3*  HEPARINUNFRC  --   --    < > 1.10*   < > 0.30 0.28* 0.37 0.34  CREATININE 1.05  --   --  1.15  --  1.16  --   --   --   TROPONINIHS 3 3  --   --   --   --   --   --   --    < > = values in this interval not displayed.    Estimated Creatinine Clearance: 123 mL/min (by C-G formula based on SCr of 1.16 mg/dL).   Assessment: 25 y/o M presents with PE. CT shows extensive bilateral PE. No definite R heart strain. CBC stable. To begin heparin per pharmacy. Patient has history of DVTs. Pt filled and picked up a Xarelto starter pack in Aug 2020 (using the $0 copay discount card) but never filled the maintenance dose tablets. He was also prescribed a Xarelto starter pack in January for DVT but due to cost has not picked up this medication.  Today, 09/14/20:  AM heparin level = 0.34 units/mL therapeutic with heparin gtt @ 1550 units/hr   INR = 1.3  CBC: H/H, Pltc WNL   No bleeding or complications noted  Hypercoagulable work-up ordered by Heme/Onc - results pending   Goal of Therapy:  Heparin level 0.3-0.7 units/ml  INR 2-3 Monitor platelets by anticoagulation protocol:  Yes   Plan:  Continue heparin infusion at 1550 units/hr  Repeat Warfarin 10mg  PO x 1 today Daily CBC, heparin level, PT/INR Patient will need minimum overlap of warfarin with heparin (or lovenox) x 5 days and INR therapeutic x at least 24 hours  Pharmacy to provide education on warfarin therapy prior to discharge    09/16/20, PharmD 09/14/2020 1:50 AM

## 2020-09-15 ENCOUNTER — Other Ambulatory Visit: Payer: Self-pay | Admitting: Oncology

## 2020-09-15 ENCOUNTER — Ambulatory Visit: Payer: Self-pay | Admitting: Nurse Practitioner

## 2020-09-15 DIAGNOSIS — I2699 Other pulmonary embolism without acute cor pulmonale: Secondary | ICD-10-CM

## 2020-09-15 DIAGNOSIS — D689 Coagulation defect, unspecified: Secondary | ICD-10-CM | POA: Insufficient documentation

## 2020-09-15 LAB — PROTEIN S, TOTAL: Protein S Ag, Total: 103 % (ref 60–150)

## 2020-09-15 LAB — CBC
HCT: 43.3 % (ref 39.0–52.0)
Hemoglobin: 14.5 g/dL (ref 13.0–17.0)
MCH: 29.8 pg (ref 26.0–34.0)
MCHC: 33.5 g/dL (ref 30.0–36.0)
MCV: 89.1 fL (ref 80.0–100.0)
Platelets: 230 10*3/uL (ref 150–400)
RBC: 4.86 MIL/uL (ref 4.22–5.81)
RDW: 13.1 % (ref 11.5–15.5)
WBC: 8.4 10*3/uL (ref 4.0–10.5)
nRBC: 0 % (ref 0.0–0.2)

## 2020-09-15 LAB — PROTIME-INR
INR: 1.5 — ABNORMAL HIGH (ref 0.8–1.2)
Prothrombin Time: 17.5 seconds — ABNORMAL HIGH (ref 11.4–15.2)

## 2020-09-15 LAB — BETA-2-GLYCOPROTEIN I ABS, IGG/M/A
Beta-2 Glyco I IgG: <9 GPI IgG units (ref 0–20)
Beta-2-Glycoprotein I IgA: <9 GPI IgA units (ref 0–25)
Beta-2-Glycoprotein I IgM: 20 GPI IgM units (ref 0–32)

## 2020-09-15 LAB — HEPARIN LEVEL (UNFRACTIONATED): Heparin Unfractionated: 0.51 IU/mL (ref 0.30–0.70)

## 2020-09-15 LAB — PROTEIN C ACTIVITY: Protein C Activity: 114 % (ref 73–180)

## 2020-09-15 LAB — PROTEIN S ACTIVITY: Protein S Activity: 69 % (ref 63–140)

## 2020-09-15 MED ORDER — ENOXAPARIN SODIUM 150 MG/ML ~~LOC~~ SOLN: 1.5000 mg/kg | SUBCUTANEOUS | 0 refills | Status: DC

## 2020-09-15 MED ORDER — ENOXAPARIN SODIUM 150 MG/ML ~~LOC~~ SOLN: 150.0000 mg | SUBCUTANEOUS | 0 refills | Status: DC

## 2020-09-15 MED ORDER — WARFARIN SODIUM 5 MG PO TABS: 10.0000 mg | ORAL_TABLET | Freq: Once | ORAL | Status: DC

## 2020-09-15 MED ORDER — ENOXAPARIN SODIUM 300 MG/3ML IJ SOLN
1.5000 mg/kg | INTRAMUSCULAR | Status: DC
  Administered 2020-09-15: 165 mg via SUBCUTANEOUS
  Filled 2020-09-15: qty 1.65

## 2020-09-15 MED ORDER — WARFARIN SODIUM 5 MG PO TABS
10.0000 mg | ORAL_TABLET | Freq: Once | ORAL | Status: AC
  Administered 2020-09-15: 10 mg via ORAL
  Filled 2020-09-15: qty 2

## 2020-09-15 MED ORDER — WARFARIN SODIUM 7.5 MG PO TABS: 7.5000 mg | ORAL_TABLET | Freq: Every day | ORAL | 11 refills | Status: DC

## 2020-09-15 MED FILL — WARFARIN SODIUM 7.5 MG TAB: 7.5 | 30 days supply | Qty: 30 | Fill #0

## 2020-09-15 MED FILL — ENOXAPARIN SODIUM 150 MG/ML: 150 | 5 days supply | Qty: 5 | Fill #0

## 2020-09-15 NOTE — Discharge Summary (Signed)
Physician Discharge Summary  Jared Carlson BJY:[id number] DOB: 1996/05/24 DOA: 09/11/2020  PCP: Patient, No Pcp Per  Admit date: 09/11/2020 Discharge date: 09/15/2020  Time spent: 50* minutes  Recommendations for Outpatient Follow-up:  1. Follow-up Dr. Darnelle Catalan on 09/18/2020 at 4 PM 2. Follow-up community health and wellness clinic 3. Patient to be discharged on oxygen 3 L/min via nasal cannula 4. Patient to get INR checked at Dr. 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  Patient has been donating plasma since 2018 and did that few days ago.  Patient says he developed right-sided chest pain, he tried to ignore it for first day.  He was having worsening shortness of breath.  Patient was initially diagnosed with DVT in October 2020 and was discharged on Xarelto.  Patient says that after starter pack was gone he was unable to get refill.  And he had another DVT in December 2021 at that time also he was sent home on Xarelto but again could not get refills on this medication as it was too expensive. Now patient presented to the ED on 09/12/2020 with CTA showing extensive pulmonary emboli bilaterally with associated right lower lobe pulmonary infarction. Patient also had syncope with bilateral lower extremity flaccid paralysis in December 2021 at that time he was seen by neurology.  CTV brain did not show venous sinus thrombosis, MRI lumbar spine only showed mild foraminal stenosis.  EEG on 06/15/2020 was read as normal with no evidence of seizures.  Outpatient EMG/NCS was recommended.   Hospital Course:  1. Acute pulmonary embolism with right lower lobe pulmonary  infarct-patient has had multiple episodes of DVTs since August 2020.  He will has been prescribed Xarelto in the past twice however could not get refills as he could not afford it.  Now he is coming with acute pulmonary embolism.  Hematology/oncology was consulted.  Dr. Darnelle Catalan has seen the patient.  Patient was discharged twice on Xarelto in the past and every time he stopped taking after starter pack, so decision was made to start him on Coumadin with heparin bridging.  Patient wants to go home and does not want to stay in the hospital for INR to become therapeutic.  He has been explained fully that this is not the optimal treatment but we will discharge him on Lovenox 150 mg subcu daily from 09/16/2020 for 5 days along with Coumadin 7.5 mg daily.  This was discussed with Dr. Darnelle Catalan and he agrees with the plan.   Patient will follow up with Dr. Darnelle Catalan in the cancer center to get PT/INR checked till he gets his own PCP.  Echocardiogram shows normal right ventricular pressure.  PA pressure could not be regulated due to tricuspid regurgitation.  Patient is requiring oxygen 3 L/min.  Hypercoagulability work-up has been ordered per hematology/oncology.  Patient will need lifelong anticoagulation.  2. Right-sided chest pain-resolved after patient was given IV Toradol.  Likely from pleuritic chest pain from right lower lobe pulmonary infarction.    3. Syncope-patient was seen by neurology in December, patient had work-up for syncope with lower extremity flaccid paralysis at that time CT brain did not show venous sinus thrombosis.  MRI lumbar spine only  showed mild foraminal stenosis.  Curious constellation of symptoms and exam which was significantly out of proportion to imaging findings.  He also had EEG which showed no evidence of seizures.  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  I called and discussed with neurologist  on-call Dr. Iver Nestle, she reviewed the notes from neurology consultation in December 2021.  She does not feel any further investigations are needed at this time.  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  We will continue to monitor. 4.  This morning patient told me that he wants to leave the hospital AMA.  Explained to the patient that he is still not ready for discharge since INR is 1.5 and he is getting bridging with IV heparin and Coumadin.  Explained to him that this can be life-threatening as he is not fully anticoagulated, patient however is adamant to leave the hospital.  I called and discussed with hematology/oncology Dr. Darnelle Catalan, who saw patient over the weekend.  He also feels that patient should stay in the hospital however, if he is adamant to leave the hospital we can discharge him on Coumadin 7.5 mg daily and Lovenox 150 mg subcu daily starting 09/16/2020.  He will see patient in the clinic on 09/18/2020 at 4 PM and check his INR and adjust Coumadin as needed.  I explained to the patient that this treatment is not optimum treatment, but we are still doing it as he is is adamant to leave.  I also called and explained this to patient's mother on phone.  Patient is still hypoxemic on ambulation, and will be discharged on home oxygen 3 L/min.        Procedures:  Echocardiogram  Consultations:  Hematology/oncology  Discharge Exam: Vitals:   09/14/20 2110 09/15/20 0451  BP: (!) 128/58 109/66  Pulse: 70 90  Resp: 20 20  Temp: 98.3 F (36.8 C) 99.5 F (37.5 C)  SpO2: 100% 96%    General: Appears in no acute distress Cardiovascular: S1-S2, regular Respiratory: Clear to auscultation bilaterally  Discharge Instructions   Discharge Instructions    Diet - low sodium heart healthy   Complete by: As directed    Increase activity slowly   Complete by: As directed      Allergies as of 09/15/2020   No Known Allergies     Medication List     STOP taking these medications   Rivaroxaban Stater Pack (15 mg and 20 mg) Commonly known as: XARELTO STARTER PACK     TAKE these medications   enoxaparin 150 MG/ML injection Commonly known as: LOVENOX Inject 1.1 mLs (165 mg total) into the skin daily for 5 days. Start taking on: September 16, 2020   warfarin 7.5 MG tablet Commonly known as: Coumadin Take 1 tablet (7.5 mg total) by mouth daily. Start taking on: September 16, 2020            Durable Medical Equipment  (From admission, onward)         Start     Ordered   09/15/20 1015  For home use only DME oxygen  Once       Question Answer Comment  Length of Need 6 Months   Mode or (Route) Nasal cannula   Liters per Minute 3   Frequency Continuous (stationary and portable oxygen unit needed)   Oxygen delivery system Gas      09/15/20 1015  No Known Allergies  Follow-up Information    [facility]. Go on 09/15/2020.   Specialty: Internal Medicine Why: 9:20a Contact information: [address] [place] [address] [phone]       Magrinat, Valentino Hue, MD Follow up on 09/18/2020.   Specialty: Oncology Why: Arrive for appintment at 4 :00 pm Contact information: [address] [place] [address] [phone]                The results of significant diagnostics from this hospitalization (including imaging, microbiology, ancillary and laboratory) are listed below for reference.    Significant Diagnostic Studies: DG Chest 2 View  Result Date: 09/11/2020 CLINICAL DATA:  Chest pain and shortness of breath. EXAM: CHEST - 2 VIEW COMPARISON:  Chest CTA  06/11/2020 FINDINGS: Patchy peripheral opacity at the right lung base, likely localizing to the right middle lobe. Left lung is clear. Overall low lung volumes. Normal heart size and mediastinal contours. No pneumothorax. No large pleural effusion. No acute osseous abnormalities are seen. IMPRESSION: 1. Patchy  right lung base opacity, may represent pneumonia in the appropriate clinical setting. Recommend correlation for infectious symptoms. 2. Pulmonary infarct could have a similar radiographic appearance. Consider chest CTA based on clinical concern. Electronically Signed   By: Narda Rutherford M.D.   On: 09/11/2020 23:39   CT Angio Chest PE W and/or Wo Contrast  Result Date: 09/12/2020 CLINICAL DATA:  Right-sided chest pain. Shortness of breath. History of DVT. Pulmonary embolus suspected. EXAM: CT ANGIOGRAPHY CHEST WITH CONTRAST TECHNIQUE: Multidetector CT imaging of the chest was performed using the standard protocol during bolus administration of intravenous contrast. Multiplanar CT image reconstructions and MIPs were obtained to evaluate the vascular anatomy. CONTRAST:  OMNIPAQUE IOHEXOL 350 MG/ML SOLN COMPARISON:  Chest x-ray 09/11/2020, CT angio 06/11/2020 FINDINGS: Cardiovascular: Poor opacification of the pulmonary arteries to the segmental level. Central right pulmonary emboli extending to all lobes at the segmental and subsegmental levels. Segmental left lower and upper lobe pulmonary emboli extending to the subsegmental level. The main pulmonary artery is normal in caliber. Normal heart size. No increased right to left ventricular ratio. No straightening or paradoxical bowing of the interventricular septum. No significant pericardial effusion. The thoracic aorta is normal in caliber. No atherosclerotic plaque of the thoracic aorta. No coronary artery calcifications. Mediastinum/Nodes: No enlarged mediastinal, hilar, or axillary lymph nodes. Thyroid gland, trachea, and esophagus demonstrate no significant findings. Lungs/Pleura: There is a peripheral wedge-shaped ground-glass bubbly appearing opacity within the right lower lobe that likely represents a pulmonary infarction. Stable pulmonary micronodule within left lower lobe (5:41). No pleural effusion. No pneumothorax. Upper Abdomen: No acute  abnormality. Musculoskeletal: No chest wall abnormality. No acute or significant osseous findings. Review of the MIP images confirms the above findings. IMPRESSION: 1. Extensive pulmonary emboli bilaterally with associated right lower lobe pulmonary infarction. No definite right heart strain. 2. Central right pulmonary emboli extending to all lobes at the segmental and subsegmental levels. Segmental and subsegmental left lower and upper lobe pulmonary emboli. These results were called by telephone at the time of interpretation on 09/12/2020 at 2:12 am to provider Geoffery Lyons , who verbally acknowledged these results. Electronically Signed   By: Tish Frederickson M.D.   On: 09/12/2020 02:18   ECHOCARDIOGRAM COMPLETE  Result Date: 09/13/2020    ECHOCARDIOGRAM REPORT   Patient Name:   IASIAH OZMENT Date of Exam: 09/13/2020 Medical Rec #:  [id number]  Height:       69.0 in Accession #:    [id number]     Weight:       250.0 lb Date of Birth:  02-27-1996      BSA:          2.272 m Patient Age:    25 years       BP:           152/77 mmHg Patient Gender: M              HR:           81 bpm. Exam Location:  Inpatient Procedure: 2D Echo, Cardiac Doppler and Color Doppler Indications:    I26.02 Pulmonary embolus  History:        Patient has no prior history of Echocardiogram examinations.                 DVT.  Sonographer:    Tiffany Dance Referring Phys: [id number] Teddy SpikeYRONE A KYLE IMPRESSIONS  1. Left ventricular ejection fraction, by estimation, is 55 to 60%. The left ventricle has normal function. The left ventricle has no regional wall motion abnormalities. There is mild concentric left ventricular hypertrophy. Left ventricular diastolic parameters are indeterminate.  2. Right ventricular systolic function is normal. The right ventricular size is normal. Tricuspid regurgitation signal is inadequate for assessing PA pressure.  3. The mitral valve is normal in structure. No evidence of mitral valve regurgitation. No evidence  of mitral stenosis.  4. The aortic valve is tricuspid. Aortic valve regurgitation is not visualized. Mild to moderate aortic valve sclerosis/calcification is present, without any evidence of aortic stenosis.  5. The inferior vena cava is normal in size with greater than 50% respiratory variability, suggesting right atrial pressure of 3 mmHg. FINDINGS  Left Ventricle: Left ventricular ejection fraction, by estimation, is 55 to 60%. The left ventricle has normal function. The left ventricle has no regional wall motion abnormalities. The left ventricular internal cavity size was normal in size. There is  mild concentric left ventricular hypertrophy. Left ventricular diastolic parameters are indeterminate. Normal left ventricular filling pressure. Right Ventricle: The right ventricular size is normal. No increase in right ventricular wall thickness. Right ventricular systolic function is normal. Tricuspid regurgitation signal is inadequate for assessing PA pressure. Left Atrium: Left atrial size was normal in size. Right Atrium: Right atrial size was normal in size. Pericardium: There is no evidence of pericardial effusion. Mitral Valve: The mitral valve is normal in structure. No evidence of mitral valve regurgitation. No evidence of mitral valve stenosis. Tricuspid Valve: The tricuspid valve is normal in structure. Tricuspid valve regurgitation is trivial. No evidence of tricuspid stenosis. Aortic Valve: The aortic valve is tricuspid. Aortic valve regurgitation is not visualized. Mild to moderate aortic valve sclerosis/calcification is present, without any evidence of aortic stenosis. Pulmonic Valve: The pulmonic valve was normal in structure. Pulmonic valve regurgitation is not visualized. No evidence of pulmonic stenosis. Aorta: The aortic root is normal in size and structure. Venous: The inferior vena cava is normal in size with greater than 50% respiratory variability, suggesting right atrial pressure of 3 mmHg.  IAS/Shunts: No atrial level shunt detected by color flow Doppler.  LEFT VENTRICLE PLAX 2D LVIDd:         4.30 cm  Diastology LVIDs:         3.00 cm  LV e' medial:    6.20 cm/s LV PW:         1.30 cm  LV E/e' medial:  9.3 LV IVS:        1.20 cm  LV e' lateral:   11.50 cm/s LVOT diam:     2.40 cm  LV E/e' lateral: 5.0 LV SV:         52 LV SV Index:   23 LVOT Area:     4.52 cm  RIGHT VENTRICLE             IVC RV Basal diam:  2.70 cm     IVC diam: 1.20 cm RV S prime:     12.00 cm/s TAPSE (M-mode): 2.0 cm LEFT ATRIUM             Index       RIGHT ATRIUM          Index LA diam:        3.10 cm 1.36 cm/m  RA Area:     9.74 cm LA Vol (A2C):   36.1 ml 15.89 ml/m RA Volume:   20.00 ml 8.80 ml/m LA Vol (A4C):   26.1 ml 11.49 ml/m LA Biplane Vol: 31.8 ml 14.00 ml/m  AORTIC VALVE LVOT Vmax:   67.90 cm/s LVOT Vmean:  50.600 cm/s LVOT VTI:    0.115 m  AORTA Ao Root diam: 2.90 cm Ao Asc diam:  2.70 cm MITRAL VALVE MV Area (PHT): 2.95 cm    SHUNTS MV Decel Time: 257 msec    Systemic VTI:  0.12 m MV E velocity: 57.40 cm/s  Systemic Diam: 2.40 cm MV A velocity: 37.30 cm/s MV E/A ratio:  1.54 Armanda Magic MD Electronically signed by Armanda Magic MD Signature Date/Time: 09/13/2020/1:57:23 PM    Final     Microbiology: Recent Results (from the past 240 hour(s))  Resp Panel by RT-PCR (Flu A&B, Covid) Nasopharyngeal Swab     Status: None   Collection Time: 09/12/20  2:50 AM   Specimen: Nasopharyngeal Swab; Nasopharyngeal(NP) swabs in vial transport medium  Result Value Ref Range Status   SARS Coronavirus 2 by RT PCR NEGATIVE NEGATIVE Final    Comment: (NOTE) SARS-CoV-2 target nucleic acids are NOT DETECTED.  The SARS-CoV-2 RNA is generally detectable in upper respiratory specimens during the acute phase of infection. The lowest concentration of SARS-CoV-2 viral copies this assay can detect is 138 copies/mL. A negative result does not preclude SARS-Cov-2 infection and should not be used as the sole basis for treatment  or other patient management decisions. A negative result may occur with  improper specimen collection/handling, submission of specimen other than nasopharyngeal swab, presence of viral mutation(s) within the areas targeted by this assay, and inadequate number of viral copies(<138 copies/mL). A negative result must be combined with clinical observations, patient history, and epidemiological information. The expected result is Negative.  Fact Sheet for Patients:  BloggerCourse.com  Fact Sheet for Healthcare Providers:  SeriousBroker.it  This test is no t yet approved or cleared by the [place] FDA and  has been authorized for detection and/or diagnosis of SARS-CoV-2 by FDA under an Emergency Use Authorization (EUA). This EUA will remain  in effect (meaning this test can be used) for the duration of the COVID-19 declaration under Section 564(b)(1) of the Act, 21 U.S.C.section 360bbb-3(b)(1), unless the authorization is terminated  or revoked sooner.       Influenza A by PCR NEGATIVE NEGATIVE Final   Influenza B by PCR NEGATIVE NEGATIVE Final    Comment: (NOTE) The Xpert Xpress SARS-CoV-2/FLU/RSV plus assay is intended as an aid in  the diagnosis of influenza from Nasopharyngeal swab specimens and should not be used as a sole basis for treatment. Nasal washings and aspirates are unacceptable for Xpert Xpress SARS-CoV-2/FLU/RSV testing.  Fact Sheet for Patients: BloggerCourse.com  Fact Sheet for Healthcare Providers: SeriousBroker.it  This test is not yet approved or cleared by the [place] FDA and has been authorized for detection and/or diagnosis of SARS-CoV-2 by FDA under an Emergency Use Authorization (EUA). This EUA will remain in effect (meaning this test can be used) for the duration of the COVID-19 declaration under Section 564(b)(1) of the Act, 21 U.S.C. section  360bbb-3(b)(1), unless the authorization is terminated or revoked.  Performed at [facility], [address] [place], [place] [address]      Labs: Basic Metabolic Panel: Recent Labs  Lab 09/11/20 2335 09/12/20 1115 09/13/20 0521  NA 136 138 135  K 3.9 3.7 3.7  CL 105 106 100  CO2 19* 23 23  GLUCOSE 140* 128* 101*  BUN 7 5* 7  CREATININE 1.05 1.15 1.16  CALCIUM 9.1 9.2 8.9   Liver Function Tests: Recent Labs  Lab 09/12/20 1115 09/13/20 0521  AST 16 15  ALT 12 12  ALKPHOS 91 78  BILITOT 1.7* 2.1*  PROT 7.7 7.2  ALBUMIN 3.7 3.5   No results for input(s): LIPASE, AMYLASE in the last 168 hours. No results for input(s): AMMONIA in the last 168 hours. CBC: Recent Labs  Lab 09/11/20 2335 09/12/20 1115 09/13/20 0521 09/14/20 0044 09/15/20 0541  WBC 13.2* 14.2* 12.8* 11.9* 8.4  NEUTROABS  --  10.6*  --   --   --   HGB 16.7 16.1 15.6 15.3 14.5  HCT 48.4 47.4 46.4 46.0 43.3  MCV 88.5 89.4 90.8 90.0 89.1  PLT 192 191 179 180 230   BNP: BNP (last 3 results) Recent Labs    06/17/20 0730 09/12/20 1115  BNP 13.6 45.5        Signed:  Meredeth Ide MD.  [facility] 09/15/2020, 10:32 AM

## 2020-09-15 NOTE — Progress Notes (Signed)
SATURATION QUALIFICATIONS: (This note is used to comply with regulatory documentation for home oxygen)  Patient Saturations on Room Air at Rest = 99%  Patient Saturations on Room Air while Ambulating = 80%  Patient Saturations on 3 Liters of oxygen after ambulation= 96%

## 2020-09-15 NOTE — TOC Progression Note (Signed)
Transition of Care [facility]) - Progression Note    Patient Details  Name: Jared Carlson MRN: [id number] Date of Birth: 29-Jul-1995  Transition of Care [facility]) CM/SW Contact  Armanda Heritage, RN Phone Number: 09/15/2020, 11:23 AM  Clinical Narrative:    [employer] to provide home oxygen.   Expected Discharge Plan: Home/Self Care Barriers to Discharge: No Barriers Identified  Expected Discharge Plan and Services Expected Discharge Plan: Home/Self Care   Discharge Planning Services: CM Consult   Living arrangements for the past 2 months: Apartment,Single Family Home Expected Discharge Date: 09/15/20               DME Arranged: Oxygen DME Agency: [employer] Date DME Agency Contacted: 09/15/20 Time DME Agency Contacted: 1000               Social Determinants of Health (SDOH) Interventions    Readmission Risk Interventions No flowsheet data found.

## 2020-09-15 NOTE — Progress Notes (Signed)
ANTICOAGULATION CONSULT NOTE - Follow Up Consult  Pharmacy Consult for Heparin + Warfarin - Day 3 Overlap  Indication: pulmonary embolus  No Known Allergies  Patient Measurements: Height: 5\' 9"  (175.3 cm) Weight: 110.3 kg (243 lb 3.2 oz) IBW/kg (Calculated) : 70.7 Heparin Dosing Weight: 96 kg  Vital Signs: Temp: 99.5 F (37.5 C) (03/21 0451) Temp Source: Oral (03/21 0451) BP: 109/66 (03/21 0451) Pulse Rate: 90 (03/21 0451)  Labs: Recent Labs    09/12/20 1115 09/12/20 2005 09/13/20 0521 09/13/20 1151 09/13/20 1848 09/14/20 0044 09/15/20 0541  HGB 16.1  --  15.6  --   --  15.3 14.5  HCT 47.4  --  46.4  --   --  46.0 43.3  PLT 191  --  179  --   --  180 230  LABPROT  --   --   --  15.2  --  15.3* 17.5*  INR  --   --   --  1.3*  --  1.3* 1.5*  HEPARINUNFRC 1.10*   < > 0.30 0.28* 0.37 0.34 0.51  CREATININE 1.15  --  1.16  --   --   --   --    < > = values in this interval not displayed.    Estimated Creatinine Clearance: 121.2 mL/min (by C-G formula based on SCr of 1.16 mg/dL).   Assessment: 25 y/o M presents with PE. CT shows extensive bilateral PE. No definite R heart strain. CBC stable. To begin heparin per pharmacy. Patient has history of DVTs. Pt filled and picked up a Xarelto starter pack in Aug 2020 (using the $0 copay discount card) but never filled the maintenance dose tablets. He was also prescribed a Xarelto starter pack in January for DVT but due to cost has not picked up this medication.  Today, 09/15/20:  Heparin level continues to be therapeutic on current IV heparin rate of 1550 units/hr   INR subtherapeutic as expected after 2 doses of 10mg  warfarin but now appears to be responding  CBC: H/H, Pltc WNL   No bleeding or complications noted  Hypercoagulable work-up ordered by Heme/Onc - results pending   Goal of Therapy:  Heparin level 0.3-0.7 units/ml  INR 2-3 Monitor platelets by anticoagulation protocol: Yes   Plan:   Continue heparin  infusion at 1550 units/hr   Repeat Warfarin 10mg  PO x 1 today  Daily CBC, heparin level, PT/INR  Note today will be day 3 of overlap heparin/warfarin - Patient will need minimum overlap of warfarin with heparin (or lovenox) x 5 days and INR therapeutic x at least 24 hours   Pharmacy to provide education on warfarin therapy prior to discharge    09/17/20, PharmD, BCPS 09/15/2020 7:45 AM

## 2020-09-15 NOTE — Progress Notes (Addendum)
This morning patient told me that he wants to leave the hospital AMA.  Explained to the patient that he is still not ready for discharge since INR is 1.5 and he is getting bridging with IV heparin and Coumadin.  Explained to him that this can be life-threatening as he is not fully anticoagulated, patient however is adamant to leave the hospital.  I called and discussed with hematology/oncology Dr. Darnelle Catalan, who saw patient over the weekend.  He also feels that patient should stay in the hospital however, if he is adamant to leave the hospital we can discharge him on Coumadin 7.5 mg daily and Lovenox 150 mg subcu daily starting 09/16/2020.  He will see patient in the clinic on 09/18/2020 at 4 PM and check his INR and adjust Coumadin as needed.  I explained to the patient that this treatment is not optimum treatment, but we are still doing it as he is is adamant to leave.  I also called and explained this to patient's mother on phone.  Patient is still hypoxemic on ambulation, and will be discharged on home oxygen 3 L/min.

## 2020-09-15 NOTE — Progress Notes (Signed)
COURTESY NOTE:   I have scheduled Jared Carlson at the [facility] 09/25/2020 at 2:30 PM for labs and f/u

## 2020-09-16 LAB — LUPUS ANTICOAGULANT PANEL
DRVVT: 45.6 s (ref 0.0–47.0)
PTT Lupus Anticoagulant: 62.6 s — ABNORMAL HIGH (ref 0.0–51.9)

## 2020-09-16 LAB — HEXAGONAL PHASE PHOSPHOLIPID: Hexagonal Phase Phospholipid: 1 s (ref 0–11)

## 2020-09-16 LAB — PTT-LA MIX: PTT-LA Mix: 53.3 s — ABNORMAL HIGH (ref 0.0–48.9)

## 2020-09-16 LAB — PROTEIN C, TOTAL: Protein C, Total: 90 % (ref 60–150)

## 2020-09-22 LAB — FACTOR 5 LEIDEN

## 2020-09-23 LAB — PROTHROMBIN GENE MUTATION

## 2020-09-25 ENCOUNTER — Inpatient Hospital Stay: Payer: Self-pay | Attending: Adult Health | Admitting: Adult Health

## 2020-09-25 ENCOUNTER — Encounter: Payer: Self-pay | Admitting: Adult Health

## 2020-09-25 ENCOUNTER — Inpatient Hospital Stay: Payer: Self-pay

## 2020-09-25 NOTE — Progress Notes (Deleted)
[place]  [phone] [phone]     ID: Jared Carlson DOB: 03/04/1996  MR#: [id number]  EHO#:[id number]  Patient Care Team: Patient, No Pcp Per (Inactive) as PCP - General ([place]) Scot Dock, NP OTHER MD:  CHIEF COMPLAINT: Recurrent unprovoked venous clots  CURRENT TREATMENT: Warfarin therapy   HISTORY OF CURRENT ILLNESS: [name] tells me he had his first deep venous thrombosis to the left leg in August 2020.  We have a note from the emergency room here 02/25/2019 documenting that the patient had been recently diagnosed with DVT and had been started on Xarelto.  However the patient was unable to continue to afford that medication  The patient tells me there was an episode of deep venous thrombosis in the opposite, right leg April 2021 but I do not find that in the records here and there are no outside records in our system.  On June 11, 2020 he presented to the emergency room here with a complaint of leg swelling and Dopplers were negative bilaterally.  The patient tells me he had passed out and neurology consult was obtained as well as extensive work-up.  Please consult Dr. Ansel Bong note 06/15/2020 for details.  In brief: "25 y.o. male [facility] episodes of blacking out of unknown duration had his typical event and woke with bilateral lower extremity flaccid paralysis, no sensory from the knees down and normal reflexes without bowel/bladder involvement. CTV brain did not show venous sinus thrombosis. MRI lumbar spine only showed mild foraminal stenoses. Curious constellation of symptoms and exam which are significantly out of proportion to imaging findings."  Additional studies included an EEG on 06/15/2020 which was read as normal with no evidence of seizures.  Outpatient EMG/NCS was recommended.  I have no records these were performed  After discharge 06/17/2020 the patient return to the emergency room  06/28/2020 with complaints of left lower leg pain and swelling.  Dopplers 06/28/2020 confirmed an acute DVT involving the tibial peroneal trunk and popliteal vein on the right.  CT angio of the chest 06/11/2020 was negative.  The patient was restarted on Xarelto and discharged.  On 09/12/2020 the patient again presented to the emergency room, this time complaining of pain to the right chest, worsening problems walking, and also passing out once in the last couple of weeks.  CT angio of the chest 09/12/2020 now shows extensive pulmonary emboli bilaterally, with associated right lower lobe pulmonary infarction.  There was no definite right heart strain.  We were consulted regarding further evaluation and management  The patient's subsequent history is as detailed below.  INTERVAL HISTORY: I met with the patient in his hospital room 09/14/2019.  No family was present  REVIEW OF SYSTEMS: The p COVID 19 VACCINATION STATUS: Patient states he sat Terril x2 with the booster July 2021  PAST MEDICAL HISTORY: Past Medical History:  Diagnosis Date  . DVT (deep venous thrombosis) ([place])     PAST SURGICAL HISTORY: No past surgical history on file.  FAMILY HISTORY Family History  Problem Relation Age of Onset  . Healthy Mother   . Healthy Father   The patient's mother is 90 years old as of March 2022.  She lives in [place].  She has 1 brother with a history of clotting, in the setting of diabetes.  The patient tells me his father died from an overdose at some point in the past.  He has half siblings on his father sides but  has no information on them.  On his mother side he is an only child   SOCIAL HISTORY:  Home lives with [name], his significant other, who is a Ship broker and works in a Proofreader.  He himself works at [employer].  He has no children.    ADVANCED DIRECTIVES: Not in place.  As to who he would name if he had a healthcare power of attorney he named his significant other  [name].   HEALTH MAINTENANCE: Social History   Tobacco Use  . Smoking status: Never Smoker  . Smokeless tobacco: Never Used  Vaping Use  . Vaping Use: Never used  Substance Use Topics  . Alcohol use: Yes  . Drug use: Yes    Frequency: 7.0 times per week    Types: Marijuana     Colonoscopy:  PAP:  Bone density:   No Known Allergies  Current Outpatient Medications  Medication Sig Dispense Refill  . enoxaparin (LOVENOX) 150 MG/ML injection Inject 1 mL (150 mg total) into the skin daily for 5 days. 5 mL 0  . warfarin (COUMADIN) 7.5 MG tablet Take 1 tablet (7.5 mg total) by mouth daily. 30 tablet 11   No current facility-administered medications for this visit.    OBJECTIVE: African-American male examined in bed  There were no vitals filed for this visit.   There is no height or weight on file to calculate BMI.   Wt Readings from Last 3 Encounters:  09/14/20 243 lb 3.2 oz (110.3 kg)  06/28/20 250 lb (113.4 kg)  03/14/19 259 lb 6.4 oz (117.7 kg)    Lymphatic: No cervical or supraclavicular adenopathy Lungs no rales or rhonchi Heart regular rate and rhythm Abd soft, nontender, positive bowel sounds Neuro: non-focal, well-oriented, appropriate affect   LAB RESULTS:  CMP     Component Value Date/Time   NA 135 09/13/2020 0521   K 3.7 09/13/2020 0521   CL 100 09/13/2020 0521   CO2 23 09/13/2020 0521   GLUCOSE 101 (H) 09/13/2020 0521   BUN 7 09/13/2020 0521   CREATININE 1.16 09/13/2020 0521   CALCIUM 8.9 09/13/2020 0521   PROT 7.2 09/13/2020 0521   ALBUMIN 3.5 09/13/2020 0521   AST 15 09/13/2020 0521   ALT 12 09/13/2020 0521   ALKPHOS 78 09/13/2020 0521   BILITOT 2.1 (H) 09/13/2020 0521   GFRNONAA >60 09/13/2020 0521   GFRAA >60 02/22/2019 1040    No results found for: TOTALPROTELP, ALBUMINELP, A1GS, A2GS, BETS, BETA2SER, GAMS, MSPIKE, SPEI  No results found for: KPAFRELGTCHN, LAMBDASER, KAPLAMBRATIO  Lab Results  Component Value Date   WBC 8.4  09/15/2020   NEUTROABS 10.6 (H) 09/12/2020   HGB 14.5 09/15/2020   HCT 43.3 09/15/2020   MCV 89.1 09/15/2020   PLT 230 09/15/2020    _0 @  No results found for: LABCA2  No components found for: [id number]  No results for input(s): INR in the last 168 hours.  No results found for: LABCA2  No results found for: MWN027  No results found for: OZD664  No results found for: QIH474  No results found for: CA2729  No components found for: HGQUANT  No results found for: CEA1 / No results found for: CEA1   No results found for: AFPTUMOR  No results found for: CHROMOGRNA  No results found for: PSA1  No visits with results within 3 Day(s) from this visit.  Latest known visit with results is:  Admission on 09/11/2020, Discharged on 09/15/2020  Component Date Value  Ref Range Status  . Sodium 09/11/2020 136  135 - 145 mmol/L Final  . Potassium 09/11/2020 3.9  3.5 - 5.1 mmol/L Final  . Chloride 09/11/2020 105  98 - 111 mmol/L Final  . CO2 09/11/2020 19* 22 - 32 mmol/L Final  . Glucose, Bld 09/11/2020 140* 70 - 99 mg/dL Final   Glucose reference range applies only to samples taken after fasting for at least 8 hours.  . BUN 09/11/2020 7  6 - 20 mg/dL Final  . Creatinine, Ser 09/11/2020 1.05  0.61 - 1.24 mg/dL Final  . Calcium 09/11/2020 9.1  8.9 - 10.3 mg/dL Final  . GFR, Estimated 09/11/2020 >60  >60 mL/min Final   Comment: (NOTE) Calculated using the CKD-EPI Creatinine Equation (2021)   . Anion gap 09/11/2020 12  5 - 15 Final   Performed at [facility], [address] [place], [place] [address]  . WBC 09/11/2020 13.2* 4.0 - 10.5 K/uL Final  . RBC 09/11/2020 5.47  4.22 - 5.81 MIL/uL Final  . Hemoglobin 09/11/2020 16.7  13.0 - 17.0 g/dL Final  . HCT 09/11/2020 48.4  39.0 - 52.0 % Final  . MCV 09/11/2020 88.5  80.0 - 100.0 fL Final  . MCH 09/11/2020 30.5  26.0 - 34.0 pg Final  . MCHC 09/11/2020 34.5  30.0 - 36.0 g/dL Final  . RDW 09/11/2020 13.3  11.5  - 15.5 % Final  . Platelets 09/11/2020 192  150 - 400 K/uL Final  . nRBC 09/11/2020 0.0  0.0 - 0.2 % Final   Performed at [facility], [address] [place], [place] [address]  . Troponin I (High Sensitivity) 09/11/2020 3  <18 ng/L Final   Comment: (NOTE) Elevated high sensitivity troponin I (hsTnI) values and significant  changes across serial measurements may suggest ACS but many other  chronic and acute conditions are known to elevate hsTnI results.  Refer to the "Links" section for chest pain algorithms and additional  guidance. Performed at [facility], [address] [place], [place] [address]   . Troponin I (High Sensitivity) 09/12/2020 3  <18 ng/L Final   Comment: (NOTE) Elevated high sensitivity troponin I (hsTnI) values and significant  changes across serial measurements may suggest ACS but many other  chronic and acute conditions are known to elevate hsTnI results.  Refer to the "Links" section for chest pain algorithms and additional  guidance. Performed at [facility], [address] [place], [place] [address]   . Heparin Unfractionated 09/12/2020 1.10* 0.30 - 0.70 IU/mL Final   Comment: RESULTS CONFIRMED BY MANUAL DILUTION (NOTE) If heparin results are below expected values, and patient dosage has  been confirmed, suggest follow up testing of antithrombin III levels. Performed at [facility], [place] [address], [place], [place] [address]   . SARS Coronavirus 2 by RT PCR 09/12/2020 NEGATIVE  NEGATIVE Final   Comment: (NOTE) SARS-CoV-2 target nucleic acids are NOT DETECTED.  The SARS-CoV-2 RNA is generally detectable in upper respiratory specimens during the acute phase of infection. The lowest concentration of SARS-CoV-2 viral copies this assay can detect is 138 copies/mL. A negative result does not preclude SARS-Cov-2 infection and should not be used as the sole basis for treatment or other patient management  decisions. A negative result may occur with  improper specimen collection/handling, submission of specimen other than nasopharyngeal swab, presence of viral mutation(s) within the areas targeted by this assay, and  inadequate number of viral copies(<138 copies/mL). A negative result must be combined with clinical observations, patient history, and epidemiological information. The expected result is Negative.  Fact Sheet for Patients:  EntrepreneurPulse.com.au  Fact Sheet for Healthcare Providers:  IncredibleEmployment.be  This test is no                          t yet approved or cleared by the [place] FDA and  has been authorized for detection and/or diagnosis of SARS-CoV-2 by FDA under an Emergency Use Authorization (EUA). This EUA will remain  in effect (meaning this test can be used) for the duration of the COVID-19 declaration under Section 564(b)(1) of the Act, 21 U.S.C.section 360bbb-3(b)(1), unless the authorization is terminated  or revoked sooner.      . Influenza A by PCR 09/12/2020 NEGATIVE  NEGATIVE Final  . Influenza B by PCR 09/12/2020 NEGATIVE  NEGATIVE Final   Comment: (NOTE) The Xpert Xpress SARS-CoV-2/FLU/RSV plus assay is intended as an aid in the diagnosis of influenza from Nasopharyngeal swab specimens and should not be used as a sole basis for treatment. Nasal washings and aspirates are unacceptable for Xpert Xpress SARS-CoV-2/FLU/RSV testing.  Fact Sheet for Patients: EntrepreneurPulse.com.au  Fact Sheet for Healthcare Providers: IncredibleEmployment.be  This test is not yet approved or cleared by the [place] FDA and has been authorized for detection and/or diagnosis of SARS-CoV-2 by FDA under an Emergency Use Authorization (EUA). This EUA will remain in effect (meaning this test can be used) for the duration of the COVID-19 declaration under Section 564(b)(1) of the  Act, 21 U.S.C. section 360bbb-3(b)(1), unless the authorization is terminated or revoked.  Performed at [facility], [address] [place], [place] [address]   . WBC 09/12/2020 14.2* 4.0 - 10.5 K/uL Final   WHITE COUNT CONFIRMED ON SMEAR  . RBC 09/12/2020 5.30  4.22 - 5.81 MIL/uL Final  . Hemoglobin 09/12/2020 16.1  13.0 - 17.0 g/dL Final  . HCT 09/12/2020 47.4  39.0 - 52.0 % Final  . MCV 09/12/2020 89.4  80.0 - 100.0 fL Final  . MCH 09/12/2020 30.4  26.0 - 34.0 pg Final  . MCHC 09/12/2020 34.0  30.0 - 36.0 g/dL Final  . RDW 09/12/2020 13.4  11.5 - 15.5 % Final  . Platelets 09/12/2020 191  150 - 400 K/uL Final  . nRBC 09/12/2020 0.0  0.0 - 0.2 % Final  . Neutrophils Relative % 09/12/2020 74  % Final  . Neutro Abs 09/12/2020 10.6* 1.7 - 7.7 K/uL Final  . Lymphocytes Relative 09/12/2020 15  % Final  . Lymphs Abs 09/12/2020 2.1  0.7 - 4.0 K/uL Final  . Monocytes Relative 09/12/2020 11  % Final  . Monocytes Absolute 09/12/2020 1.5* 0.1 - 1.0 K/uL Final  . Eosinophils Relative 09/12/2020 0  % Final  . Eosinophils Absolute 09/12/2020 0.0  0.0 - 0.5 K/uL Final  . Basophils Relative 09/12/2020 0  % Final  . Basophils Absolute 09/12/2020 0.0  0.0 - 0.1 K/uL Final  . Immature Granulocytes 09/12/2020 0  % Final  . Abs Immature Granulocytes 09/12/2020 0.05  0.00 - 0.07 K/uL Final   Performed at [facility], [place] [address], [place], [place] [address]  . Sodium 09/12/2020 138  135 - 145 mmol/L Final  . Potassium 09/12/2020 3.7  3.5 - 5.1 mmol/L Final  . Chloride 09/12/2020 106  98 - 111 mmol/L Final  . CO2  09/12/2020 23  22 - 32 mmol/L Final  . Glucose, Bld 09/12/2020 128* 70 - 99 mg/dL Final   Glucose reference range applies only to samples taken after fasting for at least 8 hours.  . BUN 09/12/2020 5* 6 - 20 mg/dL Final  . Creatinine, Ser 09/12/2020 1.15  0.61 - 1.24 mg/dL Final  . Calcium 09/12/2020 9.2  8.9 - 10.3 mg/dL Final  . Total Protein 09/12/2020  7.7  6.5 - 8.1 g/dL Final  . Albumin 09/12/2020 3.7  3.5 - 5.0 g/dL Final  . AST 09/12/2020 16  15 - 41 U/L Final  . ALT 09/12/2020 12  0 - 44 U/L Final  . Alkaline Phosphatase 09/12/2020 91  38 - 126 U/L Final  . Total Bilirubin 09/12/2020 1.7* 0.3 - 1.2 mg/dL Final  . GFR, Estimated 09/12/2020 >60  >60 mL/min Final   Comment: (NOTE) Calculated using the CKD-EPI Creatinine Equation (2021)   . Anion gap 09/12/2020 9  5 - 15 Final   Performed at [facility], [place] [address], [place], [place] [address]  . Weight 09/13/2020 4,000  oz Final  . Height 09/13/2020 69  in Final  . BP 09/13/2020 154/75  mmHg Final  . S' Lateral 09/13/2020 3.00  cm Final  . Area-P 1/2 09/13/2020 2.95  cm2 Final  . B Natriuretic Peptide 09/12/2020 45.5  0.0 - 100.0 pg/mL Final   Performed at [facility], [place] [address] [place], [place] [address]  . Heparin Unfractionated 09/12/2020 1.10* 0.30 - 0.70 IU/mL Final   Comment: (NOTE) If heparin results are below expected values, and patient dosage has  been confirmed, suggest follow up testing of antithrombin III levels. Performed at [facility], [place] [address] [place], [place] [address]   . Heparin Unfractionated 09/12/2020 0.54  0.30 - 0.70 IU/mL Final   Comment: (NOTE) If heparin results are below expected values, and patient dosage has  been confirmed, suggest follow up testing of antithrombin III levels. Performed at [facility], [place] [address] [place], [place] [address]   . Sodium 09/13/2020 135  135 - 145 mmol/L Final  . Potassium 09/13/2020 3.7  3.5 - 5.1 mmol/L Final  . Chloride 09/13/2020 100  98 - 111 mmol/L Final  . CO2 09/13/2020 23  22 - 32 mmol/L Final  . Glucose, Bld 09/13/2020 101* 70 - 99 mg/dL Final   Glucose reference range applies only to samples taken after fasting for at least 8 hours.  . BUN 09/13/2020 7  6 - 20 mg/dL Final  . Creatinine, Ser 09/13/2020  1.16  0.61 - 1.24 mg/dL Final  . Calcium 09/13/2020 8.9  8.9 - 10.3 mg/dL Final  . Total Protein 09/13/2020 7.2  6.5 - 8.1 g/dL Final  . Albumin 09/13/2020 3.5  3.5 - 5.0 g/dL Final  . AST 09/13/2020 15  15 - 41 U/L Final  . ALT 09/13/2020 12  0 - 44 U/L Final  . Alkaline Phosphatase 09/13/2020 78  38 - 126 U/L Final  . Total Bilirubin 09/13/2020 2.1* 0.3 - 1.2 mg/dL Final  . GFR, Estimated 09/13/2020 >60  >60 mL/min Final   Comment: (NOTE) Calculated using the CKD-EPI Creatinine Equation (2021)   . Anion gap 09/13/2020 12  5 - 15 Final   Performed at [facility], [place] [address] [place],  [address]  . Heparin Unfractionated 09/13/2020 0.30  0.30 - 0.70 IU/mL Final   Comment: (NOTE) If heparin  results are below expected values, and patient dosage has  been confirmed, suggest follow up testing of antithrombin III levels. Performed at [facility], [place] [address], [place], [place] [address]   . WBC 09/13/2020 12.8* 4.0 - 10.5 K/uL Final  . RBC 09/13/2020 5.11  4.22 - 5.81 MIL/uL Final  . Hemoglobin 09/13/2020 15.6  13.0 - 17.0 g/dL Final  . HCT 09/13/2020 46.4  39.0 - 52.0 % Final  . MCV 09/13/2020 90.8  80.0 - 100.0 fL Final  . MCH 09/13/2020 30.5  26.0 - 34.0 pg Final  . MCHC 09/13/2020 33.6  30.0 - 36.0 g/dL Final  . RDW 09/13/2020 13.3  11.5 - 15.5 % Final  . Platelets 09/13/2020 179  150 - 400 K/uL Final  . nRBC 09/13/2020 0.0  0.0 - 0.2 % Final   Performed at [facility], [place] [address], [place], [place] [address]  . AntiThromb III Func 09/13/2020 87  75 - 120 % Final   Performed at [place] [address] [place], [place] [address]  . Protein C Activity 09/13/2020 114  73 - 180 % Final   Comment: (NOTE) Performed At: [facility] [place], [place] [id number] Rush Farmer MD [id number]   . Protein C, Total 09/13/2020 90  60 - 150 % Final   Comment: (NOTE) Performed  At: [facility] [place], [place] [id number] Rush Farmer MD [id number]   . Protein S Activity 09/13/2020 69  63 - 140 % Final   Comment: (NOTE) Protein S activity may be falsely increased (masking an abnormal, low result) in patients receiving direct Xa inhibitor (e.g., rivaroxaban, apixaban, edoxaban) or a direct thrombin inhibitor (e.g., dabigatran) anticoagulant treatment due to assay interference by these drugs. Performed At: [facility] [place], [place] [id number] Rush Farmer MD [id number]   . Protein S Ag, Total 09/13/2020 103  60 - 150 % Final   Comment: (NOTE) This test was developed and its performance characteristics determined by [facility]. It has not been cleared or approved by the Food and Drug Administration. Performed At: [facility] [place], [place] [id number] Rush Farmer MD [id number]   . PTT Lupus Anticoagulant 09/13/2020 62.6* 0.0 - 51.9 sec Final  . DRVVT 09/13/2020 45.6  0.0 - 47.0 sec Final  . Lupus Anticoag Interp 09/13/2020 Comment:   Corrected   Comment: (NOTE) No lupus anticoagulant was detected. Results suggest the presence of an inhibitor.  The presence of heparin, which is a non-specific inhibitor, may cause this pattern of results. Since the PTT-LA was extended and the dRVVT was within normal limits, a specific inhibitor to factor VIII, IX, XI, or XII cannot be excluded. As antibody titers may fluctuate with time, repeat testing may be indicated and ideally should be performed in the absence of anticoagulant therapy. Performed At: [facility] [place], [place] [id number] Rush Farmer MD [id number]   . Beta-2 Glyco I IgG 09/13/2020 <9  0 - 20 GPI IgG units Final   Comment: (NOTE) The reference interval reflects a 3SD or 99th percentile interval, which is thought to represent a potentially clinically significant result in  accordance with the International Consensus Statement on the classification criteria for definitive antiphospholipid syndrome (APS). J Thromb Haem 2006;4:295-306.   . Beta-2-Glycoprotein I IgM 09/13/2020 20  0 - 32 GPI IgM units Final   Comment: (NOTE) The reference interval reflects a 3SD or 99th  percentile interval, which is thought to represent a potentially clinically significant result in accordance with the International Consensus Statement on the classification criteria for definitive antiphospholipid syndrome (APS). J Thromb Haem 2006;4:295-306. Performed At: [facility] [place], [place] [id number] Rush Farmer MD [id number]   . Beta-2-Glycoprotein I IgA 09/13/2020 <9  0 - 25 GPI IgA units Final   Comment: (NOTE) The reference interval reflects a 3SD or 99th percentile interval, which is thought to represent a potentially clinically significant result in accordance with the International Consensus Statement on the classification criteria for definitive antiphospholipid syndrome (APS). J Thromb Haem 2006;4:295-306.   Marland Kitchen Recommendations-F5LEID: 09/13/2020 Comment   Final   Comment: (NOTE) Result: c.1601G>A (p.Arg534Gln) - Not Detected This result is not associated with an increased risk for venous thromboembolism. See Additional Clinical Information and Comments. Additional Clinical Information: Venous thromboembolism is a multifactorial disease influenced by genetic, environmental, and circumstantial risk factors. The c.1601G>A (p. Arg534Gln) variant in the F5 gene, commonly referred to as Factor V Leiden, is a genetic risk factor for venous thromboembolism. Heterozygous carriers of this variant have a 6- to 8- fold increased risk for venous thromboembolism. Individuals homozygous for this variant (ie, with a copy of the variant on each chromosome) have an approximately 80-fold increased risk for venous thromboembolism. Individuals who carry  both a c.*97G>A variant in the F2 gene and Factor V Leiden have an approximately 20-fold increased risk for venous thromboembolism. Risks are likely to be even higher in more complex genotype combinations in                          volving the F2 c.*97G>A variant and Factor V Leiden (PMID: [id number]). Additional risk factors include but are not limited to: deficiency of protein C, protein S, or antithrombin III, age, male sex, personal or family history of deep vein thromboembolism, smoking, surgery, prolonged immobilization, malignant neoplasm, tamoxifen treatment, raloxifene treatment, oral contraceptive use, hormone replacement therapy, and pregnancy. Management of thrombotic risk and thrombotic events should follow established guidelines and fit the clinical circumstance. This result cannot predict the occurrence or recurrence of a thrombotic event. Comment: Genetic counseling is recommended to discuss the potential clinical implications of positive results, as well as recommendations for testing family members. Genetic Coordinators are available for health care providers to discuss results at [phone]). Test Details: Variant Analyzed: c.1601G>A (p. Arg534Gln), referred to as Fact                          or V Leiden Methods/Limitations: DNA analysis of the F5 gene (NM_000130.5) was performed by PCR amplification followed by restriction enzyme analysis. The diagnostic sensitivity is >99%. Results must be combined with clinical information for the most accurate interpretation. Molecular- based testing is highly accurate, but as in any laboratory test, diagnostic errors may occur. False positive or false negative results may occur for reasons that include genetic variants, blood transfusions, bone marrow transplantation, somatic or tissue-specific mosaicism, mislabeled samples, or erroneous representation of family relationships. This test was developed and its  performance characteristics determined by [facility]. It has not been cleared or approved by the Food and Drug Administration. References: [facility], Laurann [facility]; [facility] Professional Practice and Guidelines Committee. Addendum: [place] consensus statement on fac  tor V Leiden mutation testing. Genet Med. 2021 Mar 5. doi: 17.7939/Q30092-330- 01108-x. PMID: 07622633. Kristopher Oppenheim. Factor V Leiden Thrombophilia. 1999 May 14 [Updated 2018 Jan 4]. In: Tarri Glenn, Ardinger HH, Pagon RA, et al., editors. GeneReviews(R) [Internet]. [address] ([place]): [place] [employer] [place], [place]; 1993-2021. Available from: [email or web address].tn Terrilee Files, Carla Drape, Marin Shutter CS; [facility]G Laboratory Quality Assurance Committee. Venous thromboembolism laboratory testing (factor V Leiden and factor II c.*97G>A), 2018 update: a technical standard of the [place] ([facility]). Genet Med. 2018 Dec;20(12):1489-1498. doi: 35.4562/B63893-734-2876-O. Epub 2018 Oct 5. PMID: 11572620. Ruben Reason, PhD, [facility] Earlean Polka, PhD, [facility] Fannie Knee, PhD, [facility] Threasa Alpha, PhD, [facility] Gailen Shelter, PhD, [facility] Lubertha South, PhD, [facility] Alfredo Bach, PhD, [facility] Perform                          ed At: [facility] [address] [place], [place] [id number] Katina Degree MDPhD AG:[id number]   . Recommendations-PTGENE: 09/13/2020 Comment   Final   Comment: (NOTE) Result: c.*97G>A - Not Detected This result is not associated with an increased risk for venous thromboembolism. See Additional Clinical Information and Comments. Additional Clinical Information: Venous thromboembolism is a multifactorial disease influenced by genetic, environmental, and circumstantial risk factors. The c.*97G>A variant in the F2 gene is a genetic risk factor for  venous thromboembolism. Heterozygous carriers have a 2- to 4-fold increased risk for venous thromboembolism. Homozygotes for the c.*97G>A variant are rare. The annual risk of VTE in homozygotes has been reported to be 1.1%/year. Individuals who carry both a c.*97G>A variant in the F2 gene and a c.1601G>A (p. Arg534Gln) variant in the F5 gene (commonly referred to as Factor V Leiden) have an approximately 20- fold increased risk for venous thromboembolism. Risks are likely to be even higher in more complex genotype combinations involving the F2 c.*97G>A variant and Factor V Leiden (PMID:                           22482500). Additional risk factors include but are not limited to: deficiency of protein C, protein S, or antithrombin III, age, male sex, personal or family history of deep vein thromboembolism, smoking, surgery, prolonged immobilization, malignant neoplasm, tamoxifen treatment, raloxifene treatment, oral contraceptive use, hormone replacement therapy, and pregnancy. Management of thrombotic risk and thrombotic events should follow established guidelines and fit the clinical circumstance. This result cannot predict the occurrence or recurrence of a thrombotic event. Comments: Genetic counseling is recommended to discuss the potential clinical implications of positive results, as well as recommendations for testing family members. Genetic Coordinators are available for health care providers to discuss results at [phone]). Test Details: Variant analyzed: c.*97G>A, previously referred to as G20210A Methods/Limitations: DNA analysis of the F2 gene (NM_000                          506.5) was performed by PCR amplification followed by restriction enzyme analysis. The diagnostic sensitivity is >99%. Results must be combined with clinical information for the most accurate interpretation. Molecular-based testing is highly accurate, but as in any laboratory test,  diagnostic errors may occur. False positive or false negative results may occur for reasons that include genetic variants, blood transfusions, bone marrow transplantation, somatic or tissue-specific mosaicism, mislabeled samples, or erroneous representation of family relationships. This  test was developed and its performance characteristics determined by [facility]. It has not been cleared or approved by the Food and Drug Administration. References: [facility], [facility] [place] [facility]; [employer] Professional Practice and Guidelines Committee. Addendum: [place] consensus statement on factor V Leiden mutation testing. Genet Med. 2021 Mar 5. doi: 90.3009/Q330                          36-021-01108-x. PMID: 07622633. Kristopher Oppenheim. Prothrombin Thrombophilia. 2006 Jul 25 [Updated 2021 Feb 4]. In: Tarri Glenn, Ardinger HH, Pagon RA, et al., editors. GeneReviews(R) [Internet]. [address] ([place]): [place] [employer] [place], [place]; 1993-2021. Available from: https://www.cook-brown.com/ Terrilee Files, Carla Drape, Marin Shutter CS; A[employer]G Laboratory Quality Assurance Committee. Venous thromboembolism laboratory testing (factor V Leiden and factor II c.*97G>A), 2018 update: a technical standard of the [place] ([employer]). Genet Med. 2018 Dec;20(12):1489-1498. doi: 35.4562/B63893-734-2876-O. Epub 2018 Oct 5. PMID: 11572620. Ruben Reason, PhD, [facility] Earlean Polka, PhD, [facility] Fannie Knee, PhD, [facility] Threasa Alpha, PhD, [facility] W Gailen Shelter, PhD, [facility] Lubertha South, PhD, [facility] Alfredo Bach, PhD, [facility] Performed At: [facility] [address] [place], [place] [id number] Katina Degree                           MDPhD AG:[id number]   . Anticardiolipin IgG 09/13/2020 <9  0 - 14 GPL U/mL Final   Comment: (NOTE)                          Negative:              <15                           Indeterminate:     15 - 20                          Low-Med Positive: >20 - 80                          High Positive:         >80   . Anticardiolipin IgM 09/13/2020 <9  0 - 12 MPL U/mL Final   Comment: (NOTE)                          Negative:              <13                          Indeterminate:     13 - 20                          Low-Med Positive: >20 - 80                          High Positive:         >80   . Anticardiolipin IgA 09/13/2020 <9  0 - 11 APL U/mL Final   Comment: (NOTE)  Negative:              <12                          Indeterminate:     12 - 20                          Low-Med Positive: >20 - 80                          High Positive:         >80 Performed At: [facility] [address] [place], [place] [id number] Rush Farmer MD UD:[id number]   . Heparin Unfractionated 09/13/2020 0.28* 0.30 - 0.70 IU/mL Final   Comment: (NOTE) If heparin results are below expected values, and patient dosage has  been confirmed, suggest follow up testing of antithrombin III levels. Performed at [facility], [place] [address], [place], [place] [address]   . Prothrombin Time 09/13/2020 15.2  11.4 - 15.2 seconds Final  . INR 09/13/2020 1.3* 0.8 - 1.2 Final   Comment: (NOTE) INR goal varies based on device and disease states. Performed at [facility], [place] [address], [place], [place] [address]   . Heparin Unfractionated 09/13/2020 0.37  0.30 - 0.70 IU/mL Final   Comment: (NOTE) If heparin results are below expected values, and patient dosage has  been confirmed, suggest follow up testing of antithrombin III levels. Performed at [facility], [place] [address], [place], [place] [address]   . WBC 09/14/2020 11.9* 4.0 - 10.5 K/uL Final  . RBC 09/14/2020 5.11  4.22 - 5.81 MIL/uL Final  . Hemoglobin 09/14/2020 15.3  13.0 - 17.0 g/dL Final  . HCT 09/14/2020 46.0  39.0 - 52.0 % Final  .  MCV 09/14/2020 90.0  80.0 - 100.0 fL Final  . MCH 09/14/2020 29.9  26.0 - 34.0 pg Final  . MCHC 09/14/2020 33.3  30.0 - 36.0 g/dL Final  . RDW 09/14/2020 13.2  11.5 - 15.5 % Final  . Platelets 09/14/2020 180  150 - 400 K/uL Final  . nRBC 09/14/2020 0.0  0.0 - 0.2 % Final   Performed at [facility], [place] [address], [place], [place] [address]  . Prothrombin Time 09/14/2020 15.3* 11.4 - 15.2 seconds Final  . INR 09/14/2020 1.3* 0.8 - 1.2 Final   Comment: (NOTE) INR goal varies based on device and disease states. Performed at [facility], [place] [address], [place], [place] [address]   . Heparin Unfractionated 09/14/2020 0.34  0.30 - 0.70 IU/mL Final   Comment: (NOTE) If heparin results are below expected values, and patient dosage has  been confirmed, suggest follow up testing of antithrombin III levels. Performed at [facility], [place] [address] [place],  [address]   . WBC 09/15/2020 8.4  4.0 - 10.5 K/uL Final  . RBC 09/15/2020 4.86  4.22 - 5.81 MIL/uL Final  . Hemoglobin 09/15/2020 14.5  13.0 - 17.0 g/dL Final  . HCT 09/15/2020 43.3  39.0 - 52.0 % Final  . MCV 09/15/2020 89.1  80.0 - 100.0 fL Final  . MCH 09/15/2020 29.8  26.0 - 34.0 pg Final  . MCHC 09/15/2020 33.5  30.0 - 36.0 g/dL Final  . RDW 09/15/2020 13.1  11.5 - 15.5 % Final  . Platelets 09/15/2020 230  150 -  400 K/uL Final  . nRBC 09/15/2020 0.0  0.0 - 0.2 % Final   Performed at [facility], [place] [address], [place], [place] [address]  . Prothrombin Time 09/15/2020 17.5* 11.4 - 15.2 seconds Final  . INR 09/15/2020 1.5* 0.8 - 1.2 Final   Comment: (NOTE) INR goal varies based on device and disease states. Performed at [facility], [place] [address], [place], [place] [address]   . Heparin Unfractionated 09/15/2020 0.51  0.30 - 0.70 IU/mL Final   Comment: (NOTE) If heparin results are below expected values, and patient  dosage has  been confirmed, suggest follow up testing of antithrombin III levels. Performed at [facility], [place] [address], [place], [place] [address]   . PTT-LA Mix 09/13/2020 53.3* 0.0 - 48.9 sec Final   Comment: (NOTE) Performed At: [facility] [place], [place] [id number] Rush Farmer MD GQ:[id number]   . Hexagonal Phase Phospholipid 09/13/2020 1  0 - 11 sec Final   Comment: (NOTE) Performed At: [facility] [place], [place] [id number] Rush Farmer MD [phone]     (this displays the last labs from the last 3 days)  No results found for: TOTALPROTELP, ALBUMINELP, A1GS, A2GS, BETS, BETA2SER, GAMS, MSPIKE, SPEI (this displays SPEP labs)  No results found for: KPAFRELGTCHN, LAMBDASER, KAPLAMBRATIO (kappa/lambda light chains)  No results found for: HGBA, HGBA2QUANT, HGBFQUANT, HGBSQUAN (Hemoglobinopathy evaluation)   No results found for: LDH  No results found for: IRON, TIBC, IRONPCTSAT (Iron and TIBC)  No results found for: FERRITIN  Urinalysis    Component Value Date/Time   COLORURINE AMBER (A) 06/15/2020 [place] 06/15/2020 0318   LABSPEC 1.026 06/15/2020 0318   PHURINE 5.0 06/15/2020 0318   GLUCOSEU NEGATIVE 06/15/2020 0318   HGBUR NEGATIVE 06/15/2020 [place] 06/15/2020 0318   KETONESUR 20 (A) 06/15/2020 0318   PROTEINUR 30 (A) 06/15/2020 0318   NITRITE NEGATIVE 06/15/2020 0318   LEUKOCYTESUR NEGATIVE 06/15/2020 0318     STUDIES: DG Chest 2 View  Result Date: 09/11/2020 CLINICAL DATA:  Chest pain and shortness of breath. EXAM: CHEST - 2 VIEW COMPARISON:  Chest CTA  06/11/2020 FINDINGS: Patchy peripheral opacity at the right lung base, likely localizing to the right middle lobe. Left lung is clear. Overall low lung volumes. Normal heart size and mediastinal contours. No pneumothorax. No large pleural effusion. No acute osseous abnormalities are  seen. IMPRESSION: 1. Patchy right lung base opacity, may represent pneumonia in the appropriate clinical setting. Recommend correlation for infectious symptoms. 2. Pulmonary infarct could have a similar radiographic appearance. Consider chest CTA based on clinical concern. Electronically Signed   By: Keith Rake M.D.   On: 09/11/2020 23:39   CT Angio Chest PE W and/or Wo Contrast  Result Date: 09/12/2020 CLINICAL DATA:  Right-sided chest pain. Shortness of breath. History of DVT. Pulmonary embolus suspected. EXAM: CT ANGIOGRAPHY CHEST WITH CONTRAST TECHNIQUE: Multidetector CT imaging of the chest was performed using the standard protocol during bolus administration of intravenous contrast. Multiplanar CT image reconstructions and MIPs were obtained to evaluate the vascular anatomy. CONTRAST:  13m OMNIPAQUE IOHEXOL 350 MG/ML SOLN COMPARISON:  Chest x-ray 09/11/2020, CT angio 06/11/2020 FINDINGS: Cardiovascular: Poor opacification of the pulmonary arteries to the segmental level. Central right pulmonary emboli extending to all lobes at the segmental and subsegmental levels. Segmental left lower and upper lobe pulmonary emboli extending to the subsegmental level. The main pulmonary artery is  normal in caliber. Normal heart size. No increased right to left ventricular ratio. No straightening or paradoxical bowing of the interventricular septum. No significant pericardial effusion. The thoracic aorta is normal in caliber. No atherosclerotic plaque of the thoracic aorta. No coronary artery calcifications. Mediastinum/Nodes: No enlarged mediastinal, hilar, or axillary lymph nodes. Thyroid gland, trachea, and esophagus demonstrate no significant findings. Lungs/Pleura: There is a peripheral wedge-shaped ground-glass bubbly appearing opacity within the right lower lobe that likely represents a pulmonary infarction. Stable pulmonary micronodule within left lower lobe (5:41). No pleural effusion. No pneumothorax.  Upper Abdomen: No acute abnormality. Musculoskeletal: No chest wall abnormality. No acute or significant osseous findings. Review of the MIP images confirms the above findings. IMPRESSION: 1. Extensive pulmonary emboli bilaterally with associated right lower lobe pulmonary infarction. No definite right heart strain. 2. Central right pulmonary emboli extending to all lobes at the segmental and subsegmental levels. Segmental and subsegmental left lower and upper lobe pulmonary emboli. These results were called by telephone at the time of interpretation on 09/12/2020 at 2:12 am to provider Veryl Speak , who verbally acknowledged these results. Electronically Signed   By: Iven Finn M.D.   On: 09/12/2020 02:18   ECHOCARDIOGRAM COMPLETE  Result Date: 09/13/2020    ECHOCARDIOGRAM REPORT   Patient Name:   Jared Carlson Date of Exam: 09/13/2020 Medical Rec #:  [id number]      Height:       69.0 in Accession #:    [id number]     Weight:       250.0 lb Date of Birth:  08-Dec-1995      BSA:          2.272 m Patient Age:    25 years       BP:           152/77 mmHg Patient Gender: M              HR:           81 bpm. Exam Location:  Inpatient Procedure: 2D Echo, Cardiac Doppler and Color Doppler Indications:    I26.02 Pulmonary embolus  History:        Patient has no prior history of Echocardiogram examinations.                 DVT.  Sonographer:    Tiffany Dance Referring Phys: [id number] [place]  1. Left ventricular ejection fraction, by estimation, is 55 to 60%. The left ventricle has normal function. The left ventricle has no regional wall motion abnormalities. There is mild concentric left ventricular hypertrophy. Left ventricular diastolic parameters are indeterminate.  2. Right ventricular systolic function is normal. The right ventricular size is normal. Tricuspid regurgitation signal is inadequate for assessing PA pressure.  3. The mitral valve is normal in structure. No evidence of mitral valve  regurgitation. No evidence of mitral stenosis.  4. The aortic valve is tricuspid. Aortic valve regurgitation is not visualized. Mild to moderate aortic valve sclerosis/calcification is present, without any evidence of aortic stenosis.  5. The inferior vena cava is normal in size with greater than 50% respiratory variability, suggesting right atrial pressure of 3 mmHg. FINDINGS  Left Ventricle: Left ventricular ejection fraction, by estimation, is 55 to 60%. The left ventricle has normal function. The left ventricle has no regional wall motion abnormalities. The left ventricular internal cavity size was normal in size. There is  mild concentric left ventricular hypertrophy. Left ventricular diastolic parameters are indeterminate. Normal  left ventricular filling pressure. Right Ventricle: The right ventricular size is normal. No increase in right ventricular wall thickness. Right ventricular systolic function is normal. Tricuspid regurgitation signal is inadequate for assessing PA pressure. Left Atrium: Left atrial size was normal in size. Right Atrium: Right atrial size was normal in size. Pericardium: There is no evidence of pericardial effusion. Mitral Valve: The mitral valve is normal in structure. No evidence of mitral valve regurgitation. No evidence of mitral valve stenosis. Tricuspid Valve: The tricuspid valve is normal in structure. Tricuspid valve regurgitation is trivial. No evidence of tricuspid stenosis. Aortic Valve: The aortic valve is tricuspid. Aortic valve regurgitation is not visualized. Mild to moderate aortic valve sclerosis/calcification is present, without any evidence of aortic stenosis. Pulmonic Valve: The pulmonic valve was normal in structure. Pulmonic valve regurgitation is not visualized. No evidence of pulmonic stenosis. Aorta: The aortic root is normal in size and structure. Venous: The inferior vena cava is normal in size with greater than 50% respiratory variability, suggesting right  atrial pressure of 3 mmHg. IAS/Shunts: No atrial level shunt detected by color flow Doppler.  LEFT VENTRICLE PLAX 2D LVIDd:         4.30 cm  Diastology LVIDs:         3.00 cm  LV e' medial:    6.20 cm/s LV PW:         1.30 cm  LV E/e' medial:  9.3 LV IVS:        1.20 cm  LV e' lateral:   11.50 cm/s LVOT diam:     2.40 cm  LV E/e' lateral: 5.0 LV SV:         52 LV SV Index:   23 LVOT Area:     4.52 cm  RIGHT VENTRICLE             IVC RV Basal diam:  2.70 cm     IVC diam: 1.20 cm RV S prime:     12.00 cm/s TAPSE (M-mode): 2.0 cm LEFT ATRIUM             Index       RIGHT ATRIUM          Index LA diam:        3.10 cm 1.36 cm/m  RA Area:     9.74 cm LA Vol (A2C):   36.1 ml 15.89 ml/m RA Volume:   20.00 ml 8.80 ml/m LA Vol (A4C):   26.1 ml 11.49 ml/m LA Biplane Vol: 31.8 ml 14.00 ml/m  AORTIC VALVE LVOT Vmax:   67.90 cm/s LVOT Vmean:  50.600 cm/s LVOT VTI:    0.115 m  AORTA Ao Root diam: 2.90 cm Ao Asc diam:  2.70 cm MITRAL VALVE MV Area (PHT): 2.95 cm    SHUNTS MV Decel Time: 257 msec    Systemic VTI:  0.12 m MV E velocity: 57.40 cm/s  Systemic Diam: 2.40 cm MV A velocity: 37.30 cm/s MV E/A ratio:  1.54 Fransico Him MD Electronically signed by Fransico Him MD Signature Date/Time: 09/13/2020/1:57:23 PM    Final     ELIGIBLE FOR AVAILABLE RESEARCH PROTOCOL: no  ASSESSMENT: 25 y.o. [place] man with a history of multiple unprovoked clots, specifically documented  (a) left lower leg acute deep venous thrombosis on Doppler 02/22/2019  (b) left lower extremity DVT Doppler 06/28/2020  (c) extensive bilateral pulmonary embolism on CT angio chest 09/12/2020  (1) in addition there is a history of recurrent syncope, status post extensive neurologic  evaluation without a specific cause or diagnosis established  PLAN: I went    Total encounter time: *** minutes  Wilber Bihari, NP 09/25/20 1:06 PM Medical Oncology and Hematology [place] [facility] [place], [place] [address] Tel.  [phone]    Fax. [phone]  *Total Encounter Time as defined by the Centers for Medicare and Medicaid Services includes, in addition to the face-to-face time of a patient visit (documented in the note above) non-face-to-face time: obtaining and reviewing outside history, ordering and reviewing medications, tests or procedures, care coordination (communications with other health care professionals or caregivers) and documentation in the medical record.

## 2020-10-10 ENCOUNTER — Emergency Department (HOSPITAL_COMMUNITY)
Admission: EM | Admit: 2020-10-10 | Discharge: 2020-10-11 | Disposition: A | Discharge status: Home / Self Care | Payer: Self-pay | Attending: Emergency Medicine | Admitting: Emergency Medicine

## 2020-10-10 ENCOUNTER — Other Ambulatory Visit: Payer: Self-pay

## 2020-10-10 DIAGNOSIS — R42 Dizziness and giddiness: Secondary | ICD-10-CM | POA: Insufficient documentation

## 2020-10-10 DIAGNOSIS — R001 Bradycardia, unspecified: Secondary | ICD-10-CM | POA: Insufficient documentation

## 2020-10-10 DIAGNOSIS — R1010 Upper abdominal pain, unspecified: Secondary | ICD-10-CM | POA: Insufficient documentation

## 2020-10-10 DIAGNOSIS — R61 Generalized hyperhidrosis: Secondary | ICD-10-CM | POA: Insufficient documentation

## 2020-10-10 DIAGNOSIS — R112 Nausea with vomiting, unspecified: Secondary | ICD-10-CM | POA: Insufficient documentation

## 2020-10-10 DIAGNOSIS — Z7901 Long term (current) use of anticoagulants: Secondary | ICD-10-CM | POA: Insufficient documentation

## 2020-10-10 MED ORDER — ONDANSETRON HCL 4 MG/2ML IJ SOLN
4.0000 mg | Freq: Once | INTRAMUSCULAR | Status: AC
  Administered 2020-10-11: 4 mg via INTRAVENOUS
  Filled 2020-10-10: qty 2

## 2020-10-10 MED ORDER — SODIUM CHLORIDE 0.9 % IV BOLUS
1000.0000 mL | Freq: Once | INTRAVENOUS | Status: AC
  Administered 2020-10-11: 1000 mL via INTRAVENOUS

## 2020-10-10 NOTE — ED Triage Notes (Signed)
BIB [facility] from home (hotel). Hx: clotting disorder. When EMS arrived pt was diaphoretic and heart rate in the 40s. For the past hour pt has had nausea and vomiting with a head ache. Pt states he just doesn't feel right. Received 4 of zofran. A&Ox4.   170/90 Heart rate 40s 99% 126 CBG

## 2020-10-11 ENCOUNTER — Other Ambulatory Visit (HOSPITAL_COMMUNITY): Payer: Self-pay

## 2020-10-11 LAB — COMPREHENSIVE METABOLIC PANEL
ALT: 21 U/L (ref 0–44)
AST: 22 U/L (ref 15–41)
Albumin: 3.3 g/dL — ABNORMAL LOW (ref 3.5–5.0)
Alkaline Phosphatase: 100 U/L (ref 38–126)
Anion gap: 5 (ref 5–15)
BUN: 7 mg/dL (ref 6–20)
CO2: 24 mmol/L (ref 22–32)
Calcium: 8.8 mg/dL — ABNORMAL LOW (ref 8.9–10.3)
Chloride: 107 mmol/L (ref 98–111)
Creatinine, Ser: 1.21 mg/dL (ref 0.61–1.24)
GFR, Estimated: >60 mL/min (ref >60)
Glucose, Bld: 153 mg/dL — ABNORMAL HIGH (ref 70–99)
Potassium: 3.9 mmol/L (ref 3.5–5.1)
Sodium: 136 mmol/L (ref 135–145)
Total Bilirubin: 0.9 mg/dL (ref 0.3–1.2)
Total Protein: 7 g/dL (ref 6.5–8.1)

## 2020-10-11 LAB — CBC WITH DIFFERENTIAL/PLATELET
Abs Immature Granulocytes: 0.08 10*3/uL — ABNORMAL HIGH (ref 0.00–0.07)
Basophils Absolute: 0 10*3/uL (ref 0.0–0.1)
Basophils Relative: 0 %
Eosinophils Absolute: 0.7 10*3/uL — ABNORMAL HIGH (ref 0.0–0.5)
Eosinophils Relative: 6 %
HCT: 46.5 % (ref 39.0–52.0)
Hemoglobin: 15.4 g/dL (ref 13.0–17.0)
Immature Granulocytes: 1 %
Lymphocytes Relative: 12 %
Lymphs Abs: 1.3 10*3/uL (ref 0.7–4.0)
MCH: 30.3 pg (ref 26.0–34.0)
MCHC: 33.1 g/dL (ref 30.0–36.0)
MCV: 91.4 fL (ref 80.0–100.0)
Monocytes Absolute: 0.7 10*3/uL (ref 0.1–1.0)
Monocytes Relative: 6 %
Neutro Abs: 8.1 10*3/uL — ABNORMAL HIGH (ref 1.7–7.7)
Neutrophils Relative %: 75 %
Platelets: 191 10*3/uL (ref 150–400)
RBC: 5.09 MIL/uL (ref 4.22–5.81)
RDW: 14.4 % (ref 11.5–15.5)
WBC: 10.8 10*3/uL — ABNORMAL HIGH (ref 4.0–10.5)
nRBC: 0 % (ref 0.0–0.2)

## 2020-10-11 LAB — LIPASE, BLOOD: Lipase: 21 U/L (ref 11–51)

## 2020-10-11 LAB — PROTIME-INR
INR: 2 — ABNORMAL HIGH (ref 0.8–1.2)
Prothrombin Time: 22.8 seconds — ABNORMAL HIGH (ref 11.4–15.2)

## 2020-10-11 MED ORDER — ONDANSETRON 4 MG PO TBDP: 4.0000 mg | ORAL_TABLET | Freq: Three times a day (TID) | ORAL | 0 refills | Status: AC | PRN

## 2020-10-11 MED ORDER — ONDANSETRON HCL 4 MG/2ML IJ SOLN
4.0000 mg | Freq: Three times a day (TID) | INTRAMUSCULAR | Status: DC | PRN
  Administered 2020-10-11: 4 mg via INTRAMUSCULAR
  Filled 2020-10-11: qty 2

## 2020-10-11 MED ORDER — SODIUM CHLORIDE 0.9 % IV SOLN
12.5000 mg | Freq: Four times a day (QID) | INTRAVENOUS | Status: DC | PRN
  Administered 2020-10-11: 12.5 mg via INTRAVENOUS
  Filled 2020-10-11: qty 0.5

## 2020-10-11 MED ORDER — PROMETHAZINE HCL 25 MG PO TABS
25.0000 mg | ORAL_TABLET | Freq: Once | ORAL | Status: AC
  Administered 2020-10-11: 25 mg via ORAL
  Filled 2020-10-11: qty 1

## 2020-10-11 NOTE — ED Provider Notes (Signed)
[facility] EMERGENCY DEPARTMENT Provider Note   CSN: [id number] Arrival date & time: 10/10/20  2300     History Chief Complaint  Patient presents with  . Nausea  . Emesis    Jared Carlson is a 25 y.o. male.  HPI     This is a 25 year old male with a history of DVT on Coumadin and known coagulopathy who presents with nausea, vomiting, chills, and dizziness.  Patient and his girlfriend provide history.  Reports that he woke up around 8 PM to walk the dog.  After that he had multiple episodes of nonbilious, nonbloody emesis.  He reports being sweaty and dizzy at the time.  No chest pain or shortness of breath.  No recent fevers.  No known sick contacts or COVID exposures.  He has not had any diarrhea.  He reports "bubbling" upper abdominal discomfort but no significant pain.  He also reports mild headache.  Per EMS, noted to be diaphoretic and bradycardic.  Heart rate was in the 40s.  This was in the setting of active emesis.  Past Medical History:  Diagnosis Date  . DVT (deep venous thrombosis) [facility])     Patient Active Problem List   Diagnosis Date Noted  . Coagulopathy (HCC) 09/15/2020  . Pulmonary emboli (HCC) 09/12/2020  . Lower extremity weakness 06/15/2020    No past surgical history on file.     Family History  Problem Relation Age of Onset  . Healthy Mother   . Healthy Father     Social History   Tobacco Use  . Smoking status: Never Smoker  . Smokeless tobacco: Never Used  Vaping Use  . Vaping Use: Never used  Substance Use Topics  . Alcohol use: Yes  . Drug use: Yes    Frequency: 7.0 times per week    Types: Marijuana    Home Medications Prior to Admission medications   Medication Sig Start Date End Date Taking? Authorizing Provider  ondansetron (ZOFRAN ODT) 4 MG disintegrating tablet Take 1 tablet (4 mg total) by mouth every 8 (eight) hours as needed for nausea or vomiting. 10/11/20  Yes Oluwatosin Higginson, Mayer Masker, MD  warfarin  (COUMADIN) 7.5 MG tablet TAKE 1 TABLET BY MOUTH ONCE A DAY Patient taking differently: Take 7.5 mg by mouth daily. 09/15/20 09/15/21 Yes Sharl Ma, Sarina Ill, MD  enoxaparin (LOVENOX) 150 MG/ML injection INJECT 1 ML UNDER THE SKIN ONCE A DAY FOR 5 DAYS Patient not taking: Reported on 10/10/2020 09/15/20 09/15/21  Meredeth Ide, MD  RIVAROXABAN Carlena Hurl) VTE STARTER PACK (15 & 20 MG) Follow package directions: Take one 15mg  tablet by mouth twice a day. On day 22, switch to one 20mg  tablet once a day. Take with food. Patient not taking: Reported on 09/12/2020 06/28/20 09/15/20  Molpus, 08/26/20, MD    Allergies    Patient has no known allergies.  Review of Systems   Review of Systems  Constitutional: Positive for chills and diaphoresis. Negative for fever.  Respiratory: Negative for shortness of breath.   Cardiovascular: Negative for chest pain.  Gastrointestinal: Positive for abdominal pain, nausea and vomiting. Negative for diarrhea.  Neurological: Positive for dizziness.  All other systems reviewed and are negative.   Physical Exam Updated Vital Signs BP 133/63   Pulse (!) 49   Temp 98 F (36.7 C)   Resp 20   Ht 1.753 m (5\' 9" )   Wt 113.4 kg   SpO2 100%   BMI 36.92 kg/m   Physical Exam  Vitals and nursing note reviewed.  Constitutional:      Appearance: He is well-developed. He is not ill-appearing.     Comments: Nontoxic-appearing  HENT:     Head: Normocephalic and atraumatic.     Nose: Nose normal.     Mouth/Throat:     Mouth: Mucous membranes are moist.  Eyes:     Pupils: Pupils are equal, round, and reactive to light.  Cardiovascular:     Rate and Rhythm: Normal rate and regular rhythm.     Heart sounds: Normal heart sounds. No murmur heard.   Pulmonary:     Effort: Pulmonary effort is normal. No respiratory distress.     Breath sounds: Normal breath sounds. No wheezing.  Abdominal:     General: Bowel sounds are normal.     Palpations: Abdomen is soft.     Tenderness: There  is no abdominal tenderness. There is no rebound.  Musculoskeletal:     Cervical back: Neck supple.     Right lower leg: No edema.     Left lower leg: No edema.  Lymphadenopathy:     Cervical: No cervical adenopathy.  Skin:    General: Skin is warm and dry.  Neurological:     Mental Status: He is alert and oriented to person, place, and time.  Psychiatric:        Mood and Affect: Mood normal.     ED Results / Procedures / Treatments   Labs (all labs ordered are listed, but only abnormal results are displayed) Labs Reviewed  CBC WITH DIFFERENTIAL/PLATELET - Abnormal; Notable for the following components:      Result Value   WBC 10.8 (*)    Neutro Abs 8.1 (*)    Eosinophils Absolute 0.7 (*)    Abs Immature Granulocytes 0.08 (*)    All other components within normal limits  COMPREHENSIVE METABOLIC PANEL - Abnormal; Notable for the following components:   Glucose, Bld 153 (*)    Calcium 8.8 (*)    Albumin 3.3 (*)    All other components within normal limits  PROTIME-INR - Abnormal; Notable for the following components:   Prothrombin Time 22.8 (*)    INR 2.0 (*)    All other components within normal limits  LIPASE, BLOOD    EKG EKG Interpretation  Date/Time:  Friday October 10 2020 23:11:12 EDT Ventricular Rate:  51 PR Interval:  165 QRS Duration: 111 QT Interval:  433 QTC Calculation: 399 R Axis:   87 Text Interpretation: Sinus rhythm Confirmed by Ross Marcus ([id number]) on 10/10/2020 11:13:45 PM   Radiology No results found.  Procedures Procedures   Medications Ordered in ED Medications  sodium chloride 0.9 % bolus 1,000 mL (0 mLs Intravenous Stopped 10/11/20 0227)  ondansetron (ZOFRAN) injection 4 mg (4 mg Intravenous Given 10/11/20 0023)  promethazine (PHENERGAN) tablet 25 mg (25 mg Oral Given 10/11/20 9030)    ED Course  I have reviewed the triage vital signs and the nursing notes.  Pertinent labs & imaging results that were available during my care of  the patient were reviewed by me and considered in my medical decision making (see chart for details).    MDM Rules/Calculators/A&P                          Patient presents with nausea and vomiting.  Onset this evening.  He is overall nontoxic.  He is actively vomiting.  He has some crampy abdominal pain  but no focal pain on exam.  Vital signs are reassuring.  Patient was given pain and nausea medication as well as fluids.  Labs obtained.  Labs largely reassuring.  No significant metabolic derangements.  White count 10.8.  Without any focal abdominal exam findings, would have low suspicion for appendicitis, pancreatitis, cholecystitis.  Suspect gastroenteritis versus gastritis.  Patient was ultimately able to p.o. challenge.  Recommend supportive measures at home.  After history, exam, and medical workup I feel the patient has been appropriately medically screened and is safe for discharge home. Pertinent diagnoses were discussed with the patient. Patient was given return precautions.  Final Clinical Impression(s) / ED Diagnoses Final diagnoses:  Non-intractable vomiting with nausea, unspecified vomiting type    Rx / DC Orders ED Discharge Orders         Ordered    ondansetron (ZOFRAN ODT) 4 MG disintegrating tablet  Every 8 hours PRN        10/11/20 0408           Shon Baton, MD 10/12/20 [phone]

## 2020-10-11 NOTE — ED Notes (Signed)
Patient verbalizes understanding of discharge instructions. Opportunity for questioning and answers were provided. Armband removed by staff, pt discharged from ED ambulatory.   

## 2020-10-11 NOTE — Discharge Instructions (Addendum)
You were seen today for nausea and vomiting.  This is likely related to a viral illness.  Take medications as prescribed.  Make sure that you are staying hydrated.

## 2020-10-11 NOTE — ED Notes (Signed)
Pt tolerated fluids well.  

## 2020-10-13 MED FILL — Warfarin Sodium Tab 7.5 MG: ORAL | 30 days supply | Qty: 30 | Fill #0 | Status: CN

## 2020-10-14 ENCOUNTER — Other Ambulatory Visit (HOSPITAL_COMMUNITY): Payer: Self-pay

## 2020-10-24 ENCOUNTER — Other Ambulatory Visit (HOSPITAL_COMMUNITY): Payer: Self-pay

## 2023-02-08 IMAGING — DX DG CHEST 2V
2 series · 2 of 2 positions shown · non-contrast
Comparison: Chest CTA  06/11/2020

CLINICAL DATA: Chest pain and shortness of breath.

EXAM:
CHEST - 2 VIEW

[chest pa]
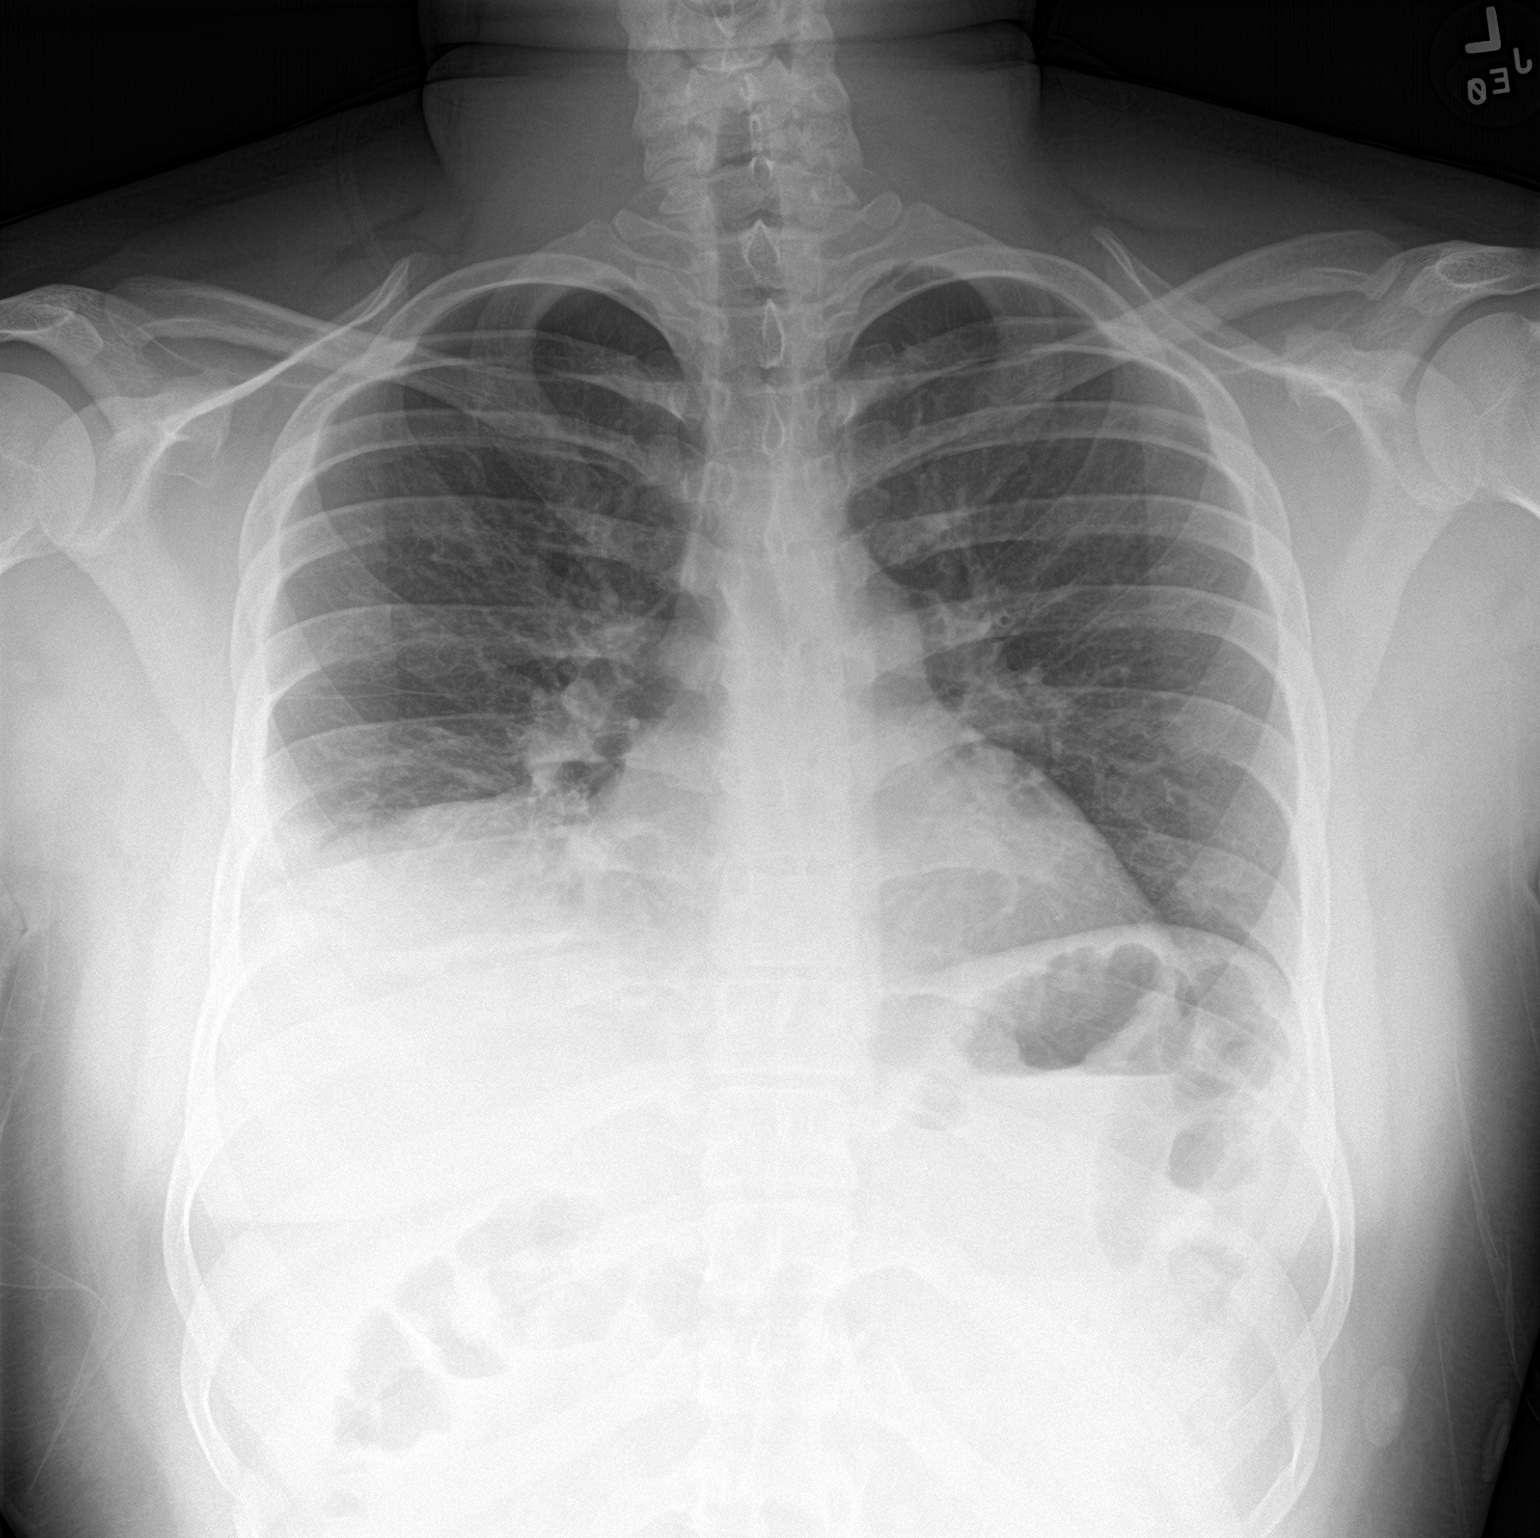

[chest lat]
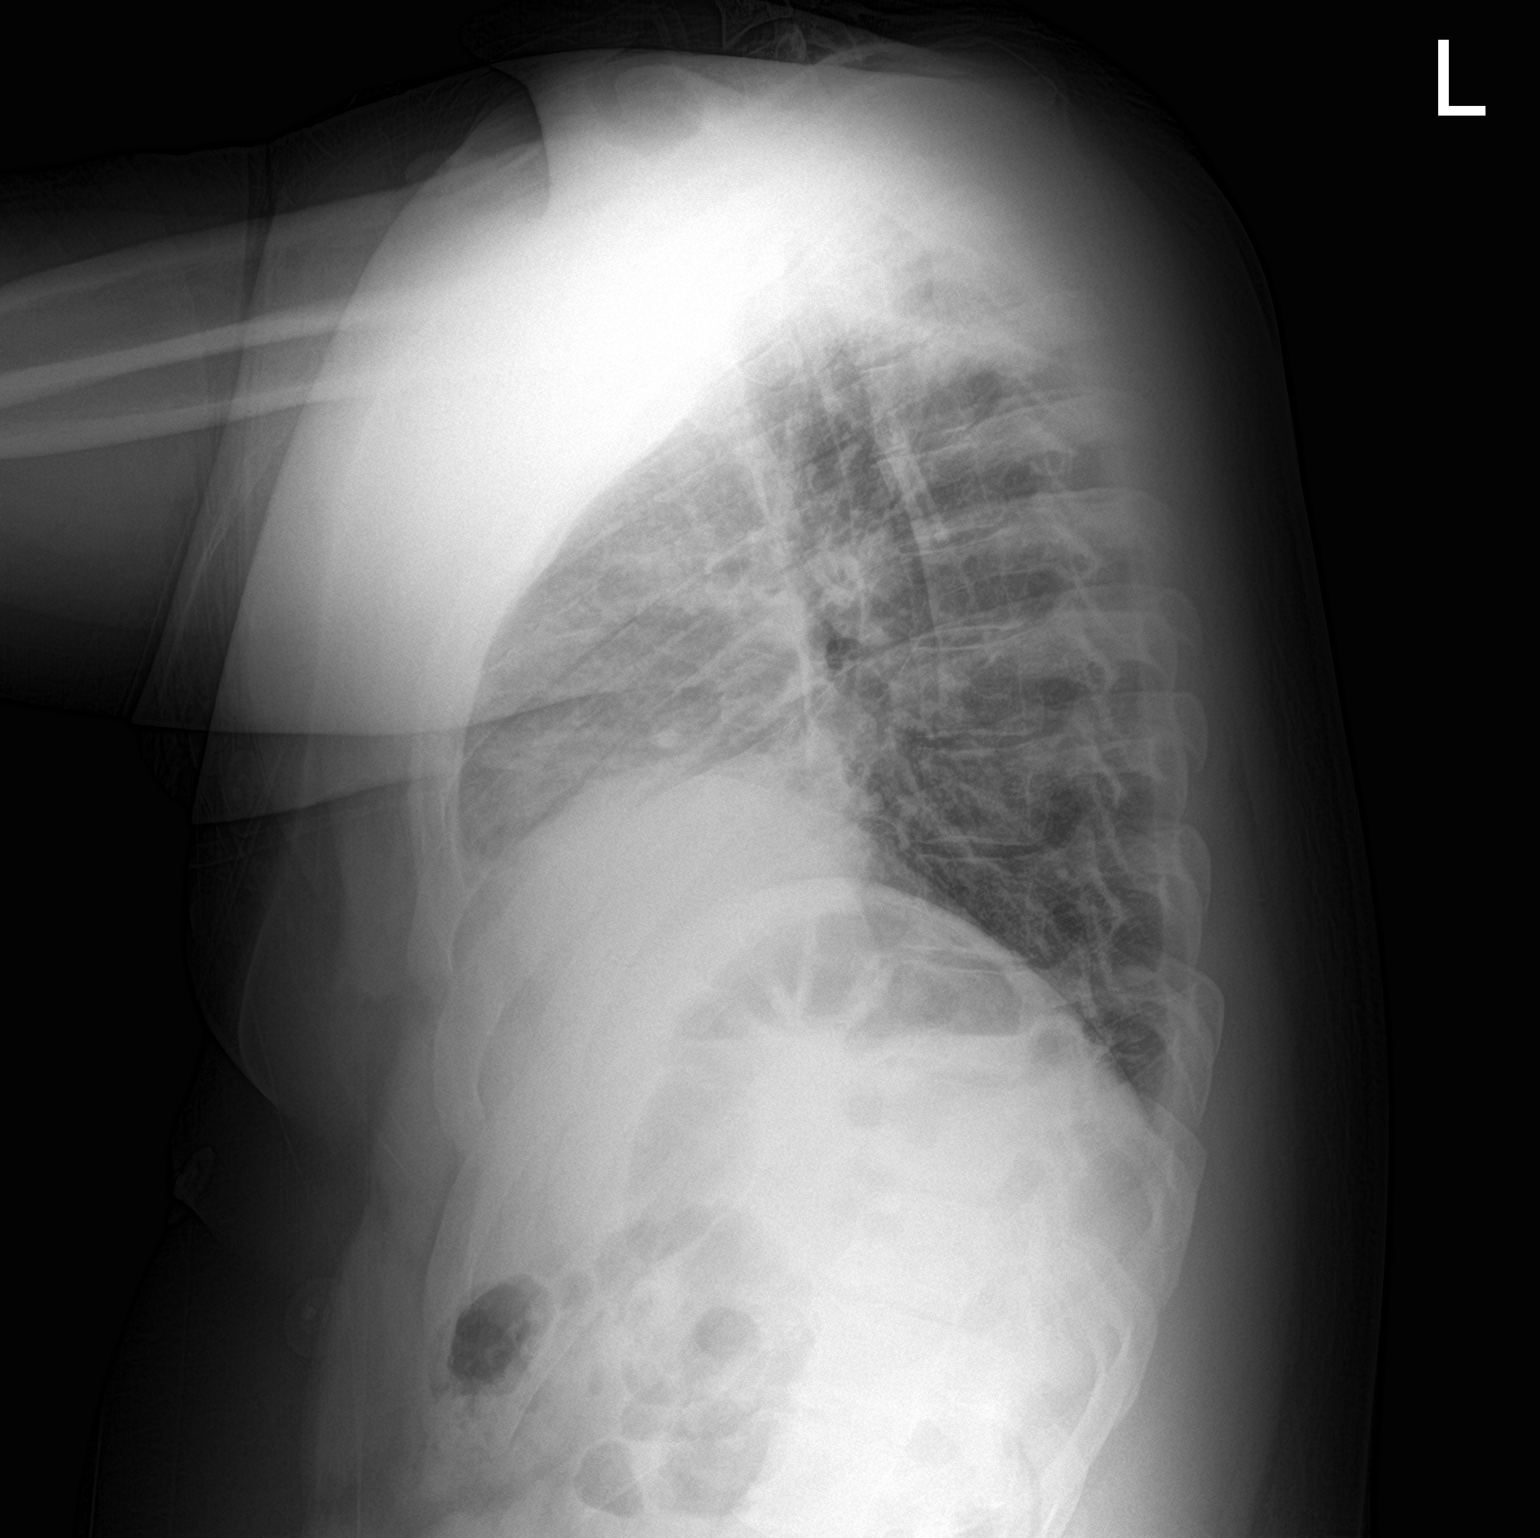

[2 of 2 positions shown; findings below may reference images not displayed]

FINDINGS: Patchy peripheral opacity at the right lung base, likely localizing
to the right middle lobe. Left lung is clear. Overall low lung
volumes. Normal heart size and mediastinal contours. No
pneumothorax. No large pleural effusion. No acute osseous
abnormalities are seen.
IMPRESSION: 1. Patchy right lung base opacity, may represent pneumonia in the
appropriate clinical setting. Recommend correlation for infectious
symptoms.
2. Pulmonary infarct could have a similar radiographic appearance.
Consider chest CTA based on clinical concern.
